# Patient Record
Sex: Female | Born: 1975 | Race: White | Hispanic: Yes | Marital: Married | Smoking: Never smoker
Health system: Southern US, Community
[De-identification: ages and names within clinical notes are randomized; demographics above are authoritative.]

## PROBLEM LIST (undated history)

## (undated) ENCOUNTER — Ambulatory Visit: Payer: Self-pay

## (undated) DIAGNOSIS — K602 Anal fissure, unspecified: Secondary | ICD-10-CM

## (undated) DIAGNOSIS — F419 Anxiety disorder, unspecified: Secondary | ICD-10-CM

## (undated) DIAGNOSIS — R7303 Prediabetes: Secondary | ICD-10-CM

## (undated) DIAGNOSIS — I10 Essential (primary) hypertension: Secondary | ICD-10-CM

## (undated) HISTORY — DX: Essential (primary) hypertension: I10

## (undated) HISTORY — DX: Prediabetes: R73.03

## (undated) HISTORY — DX: Anxiety disorder, unspecified: F41.9

## (undated) HISTORY — DX: Anal fissure, unspecified: K60.2

---

## 2005-01-23 ENCOUNTER — Inpatient Hospital Stay (HOSPITAL_COMMUNITY): Admission: AD | Admit: 2005-01-23 | Discharge: 2005-01-23 | Payer: Self-pay | Admitting: Obstetrics and Gynecology

## 2005-02-12 ENCOUNTER — Inpatient Hospital Stay (HOSPITAL_COMMUNITY): Admission: AD | Admit: 2005-02-12 | Discharge: 2005-02-12 | Payer: Self-pay | Admitting: *Deleted

## 2005-02-25 ENCOUNTER — Encounter (INDEPENDENT_AMBULATORY_CARE_PROVIDER_SITE_OTHER): Payer: Self-pay | Admitting: *Deleted

## 2005-02-25 LAB — CONVERTED CEMR LAB

## 2005-03-13 ENCOUNTER — Ambulatory Visit: Payer: Self-pay | Admitting: Family Medicine

## 2005-03-26 ENCOUNTER — Ambulatory Visit: Payer: Self-pay | Admitting: Family Medicine

## 2005-04-21 ENCOUNTER — Ambulatory Visit: Payer: Self-pay | Admitting: Family Medicine

## 2005-04-23 ENCOUNTER — Ambulatory Visit (HOSPITAL_COMMUNITY): Admission: RE | Admit: 2005-04-23 | Discharge: 2005-04-23 | Payer: Self-pay | Admitting: *Deleted

## 2005-05-21 ENCOUNTER — Ambulatory Visit: Payer: Self-pay | Admitting: Family Medicine

## 2005-05-28 ENCOUNTER — Ambulatory Visit: Payer: Self-pay | Admitting: Family Medicine

## 2005-07-08 ENCOUNTER — Ambulatory Visit: Payer: Self-pay | Admitting: Family Medicine

## 2005-07-28 ENCOUNTER — Ambulatory Visit: Payer: Self-pay | Admitting: Sports Medicine

## 2005-08-13 ENCOUNTER — Ambulatory Visit: Payer: Self-pay | Admitting: Family Medicine

## 2005-08-18 ENCOUNTER — Ambulatory Visit: Payer: Self-pay | Admitting: Family Medicine

## 2005-08-22 ENCOUNTER — Ambulatory Visit: Payer: Self-pay | Admitting: *Deleted

## 2005-08-22 ENCOUNTER — Inpatient Hospital Stay (HOSPITAL_COMMUNITY): Admission: AD | Admit: 2005-08-22 | Discharge: 2005-08-22 | Payer: Self-pay | Admitting: *Deleted

## 2005-08-26 ENCOUNTER — Inpatient Hospital Stay (HOSPITAL_COMMUNITY): Admission: AD | Admit: 2005-08-26 | Discharge: 2005-08-30 | Payer: Self-pay | Admitting: Obstetrics and Gynecology

## 2005-08-26 ENCOUNTER — Ambulatory Visit: Payer: Self-pay | Admitting: Family Medicine

## 2005-08-26 ENCOUNTER — Ambulatory Visit: Payer: Self-pay | Admitting: Obstetrics & Gynecology

## 2005-09-03 ENCOUNTER — Inpatient Hospital Stay (HOSPITAL_COMMUNITY): Admission: AD | Admit: 2005-09-03 | Discharge: 2005-09-03 | Payer: Self-pay | Admitting: Obstetrics & Gynecology

## 2005-10-06 ENCOUNTER — Ambulatory Visit: Payer: Self-pay | Admitting: Family Medicine

## 2005-10-08 ENCOUNTER — Ambulatory Visit: Payer: Self-pay | Admitting: Sports Medicine

## 2006-02-12 IMAGING — US US OB COMP LESS 14 WK
1 series · 18 of 28 positions shown · non-contrast
Comparison: none

CLINICAL DATA: 8 weeks gestation.  Bleeding.

[Series 1: us ob comp<14 wk · 18 of 36 slices shown]
[im 1/36]
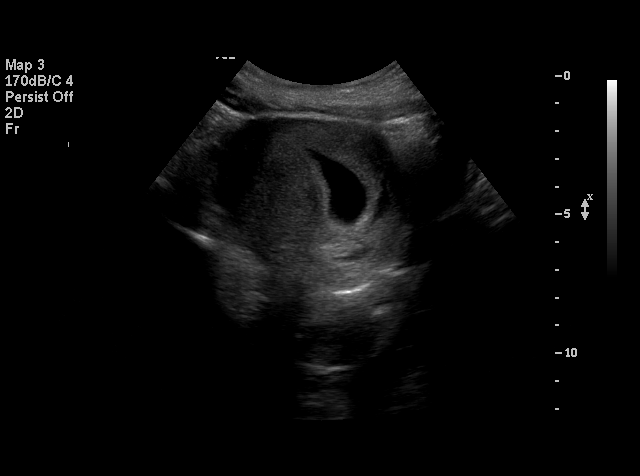
[im 3/36]
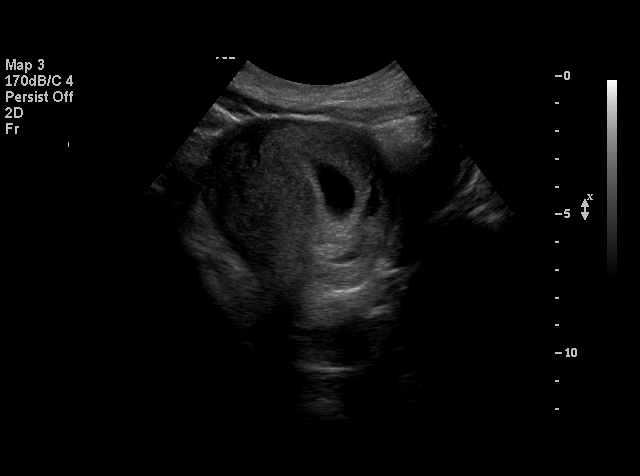
[im 4/36]
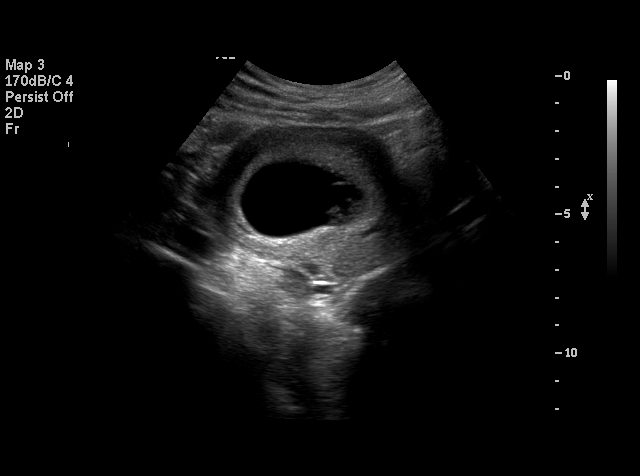
[im 7/36]
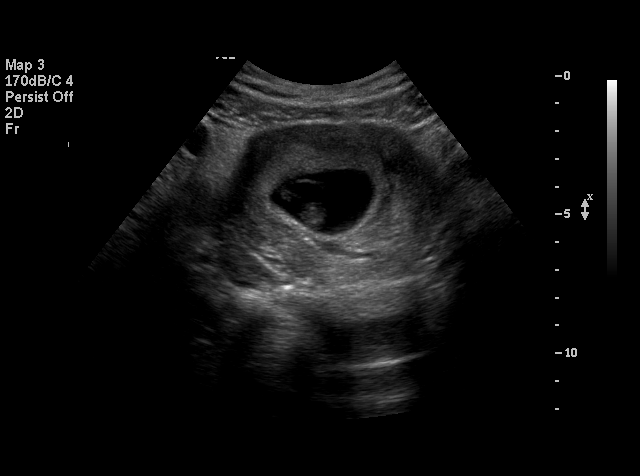
[im 10/36]
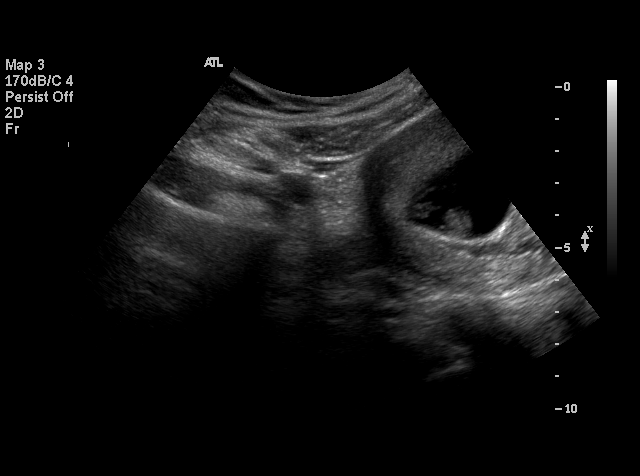
[im 11/36]
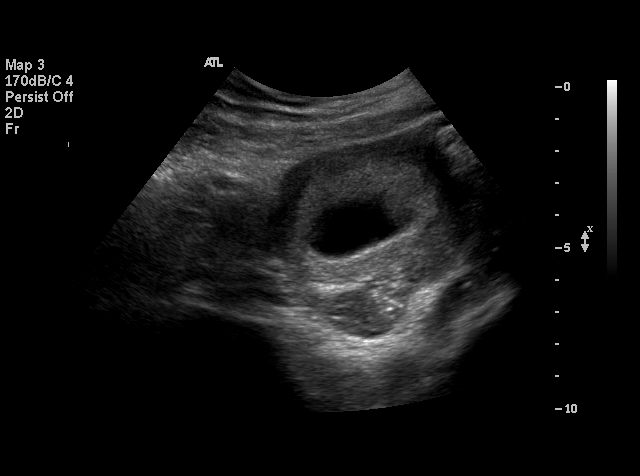
[im 13/36]
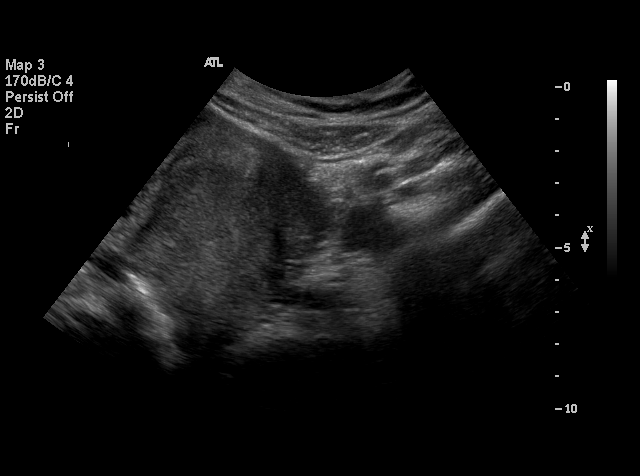
[im 15/36]
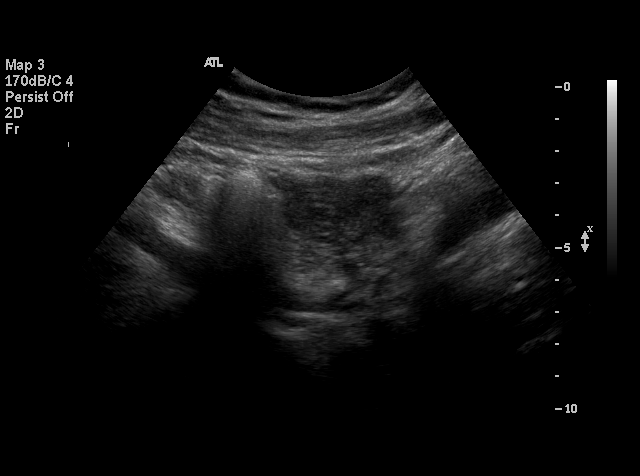
[im 17/36]
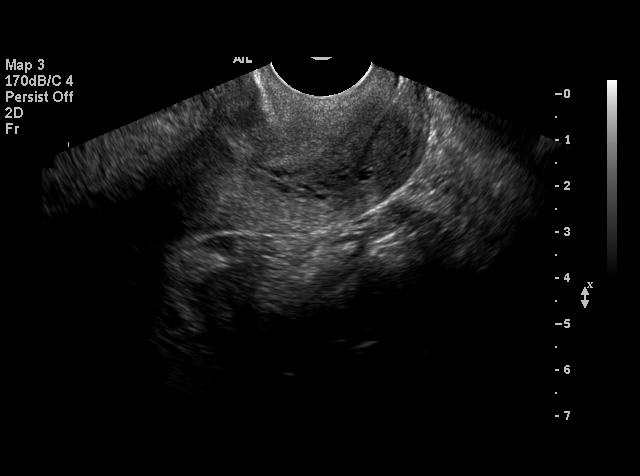
[im 19/36]
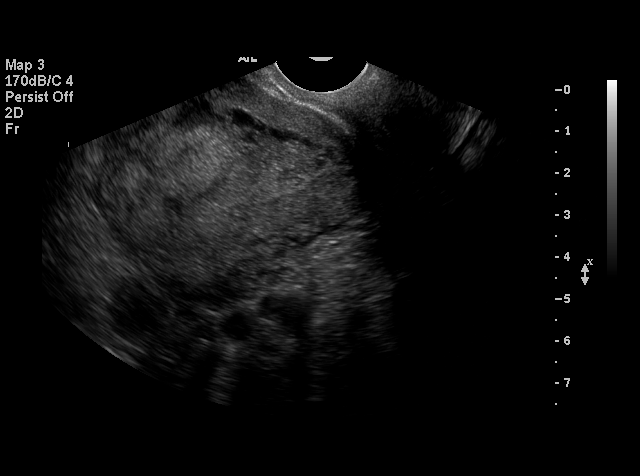
[im 21/36]
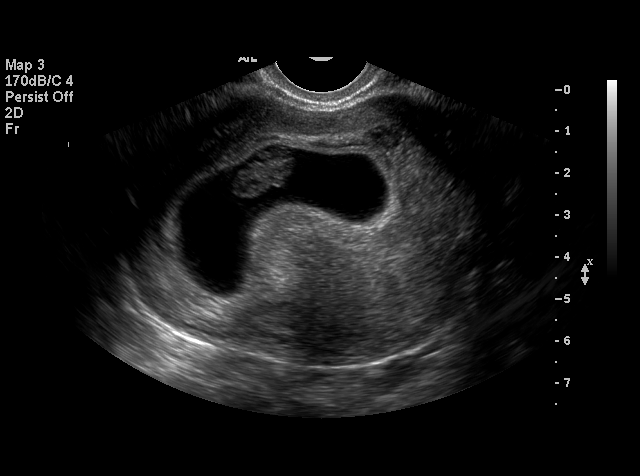
[im 23/36]
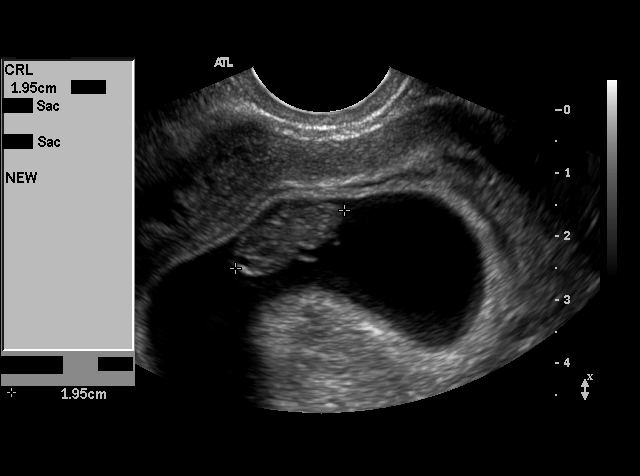
[im 25/36]
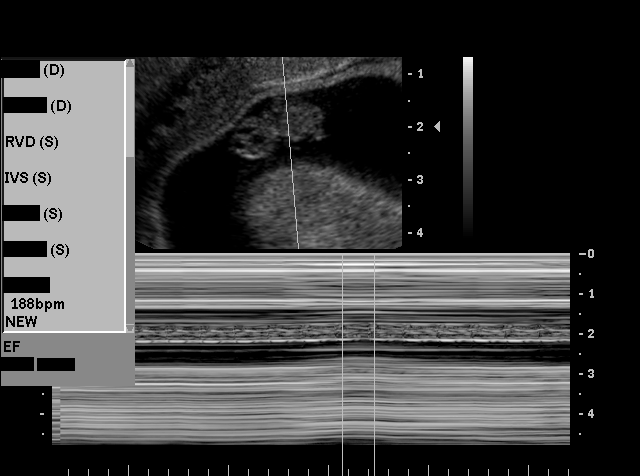
[im 28/36]
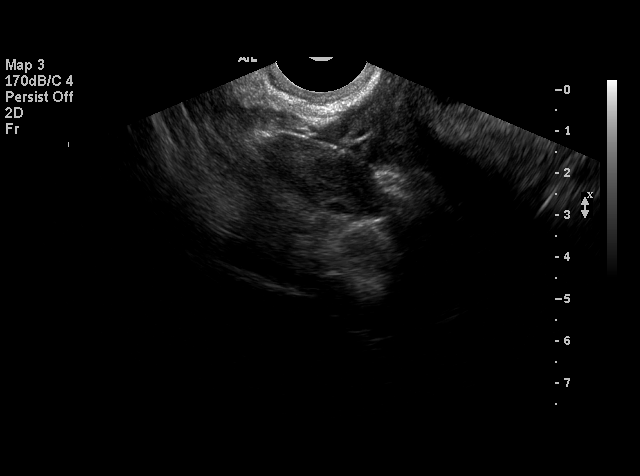
[im 29/36]
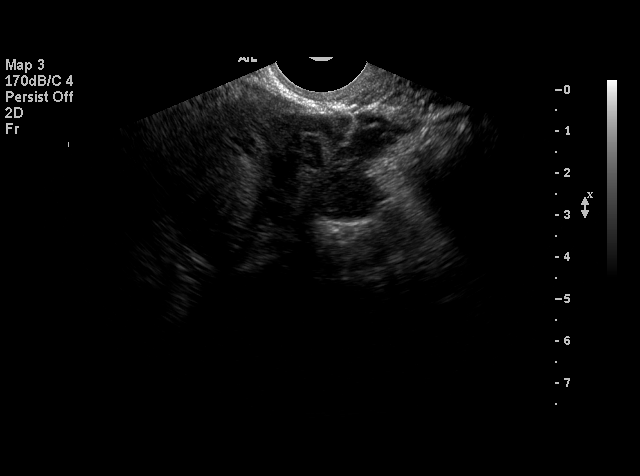
[im 32/36]
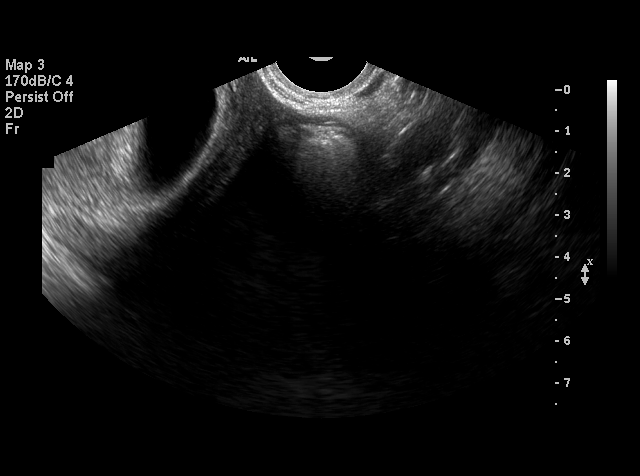
[im 33/36]
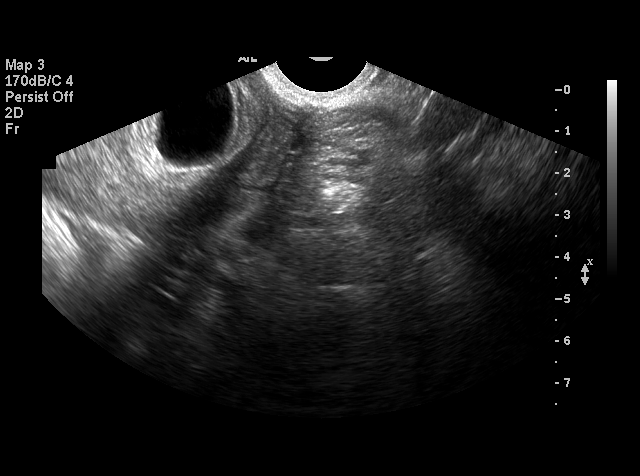
[im 36/36]
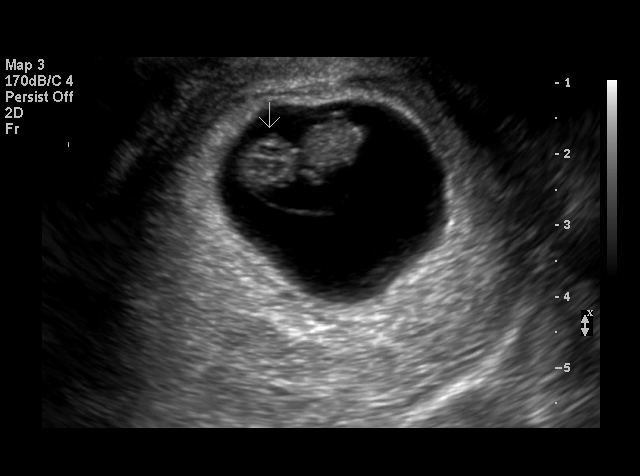

[18 of 28 positions shown; findings below may reference images not displayed]

OBSTETRICAL ULTRASOUND:

 Number of Fetuses: 1
 Heart Rate:  188
 CRL:  1.9 cm   8 w 4 d

 Ultrasound EDC: 08/31/05

 Fetal anatomy could not be evaluated due to the early gestational age.  Yolk sac is visualized.

 MATERNAL UTERINE AND ADNEXAL FINDINGS
 Cervix not evaluated.  Normal right ovary.  Left ovary not seen.
IMPRESSION: 1.  Single living intrauterine gestation at 8 weeks 4 days estimated gestational age by crown rump length.  Yolk sac and amnion are visualized.  A tiny subchorionic hemorrhage is apparent.
 2.  Right ovary is sonographically unremarkable.  The left ovary is not visualized.

## 2006-04-15 ENCOUNTER — Ambulatory Visit: Payer: Self-pay | Admitting: Internal Medicine

## 2006-04-23 ENCOUNTER — Ambulatory Visit: Payer: Self-pay | Admitting: Internal Medicine

## 2006-04-29 ENCOUNTER — Ambulatory Visit: Payer: Self-pay | Admitting: Family Medicine

## 2006-05-13 IMAGING — US US OB COMP +14 WK
1 series · 13 of 28 positions shown · non-contrast
Comparison: none

CLINICAL DATA: Size and dates.

[Series 1: us ob comp +14 wk · 0.29mm/px · 13 of 97 slices shown]
[im 4/97]
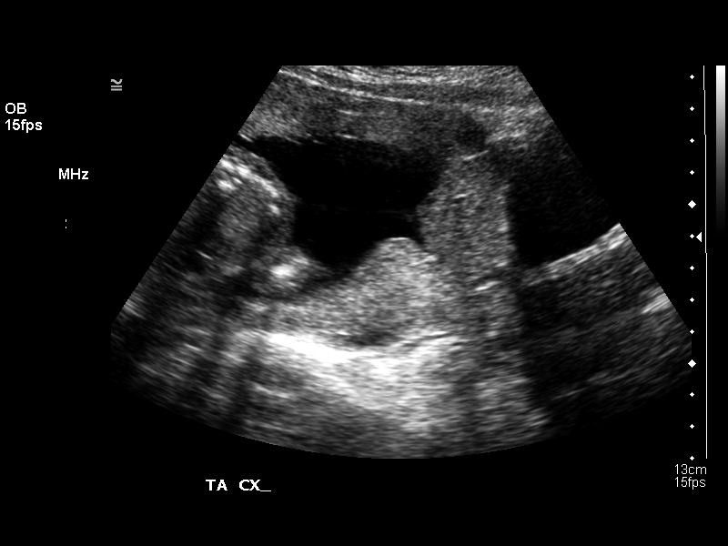
[im 11/97]
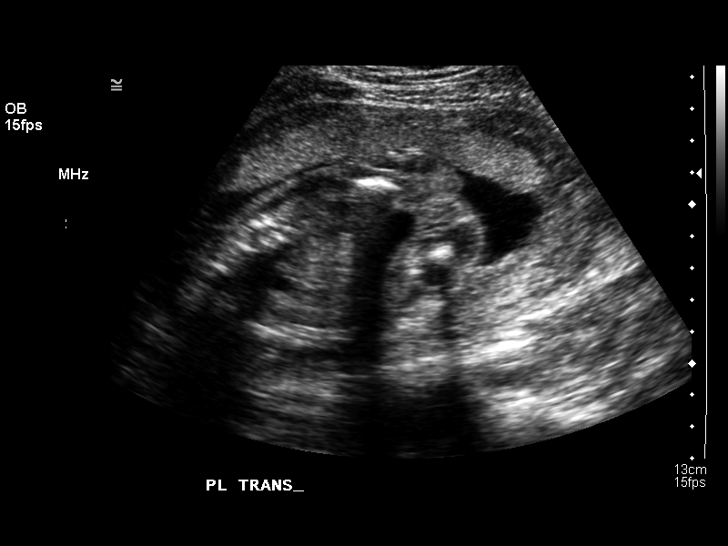
[im 18/97]
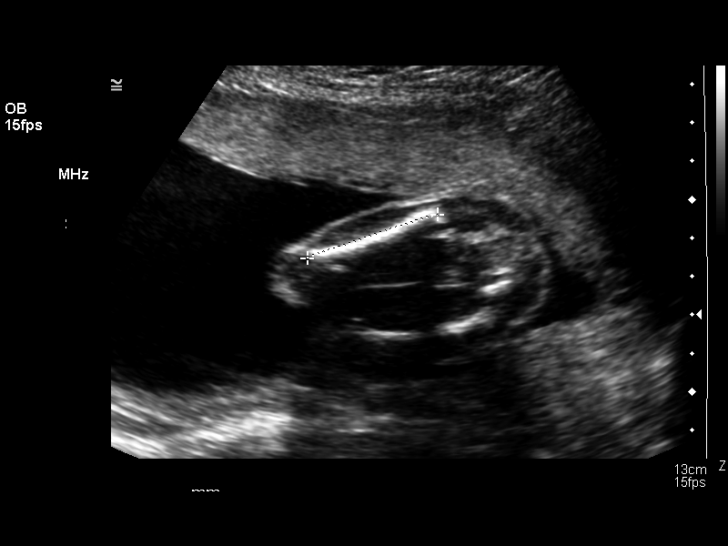
[im 25/97]
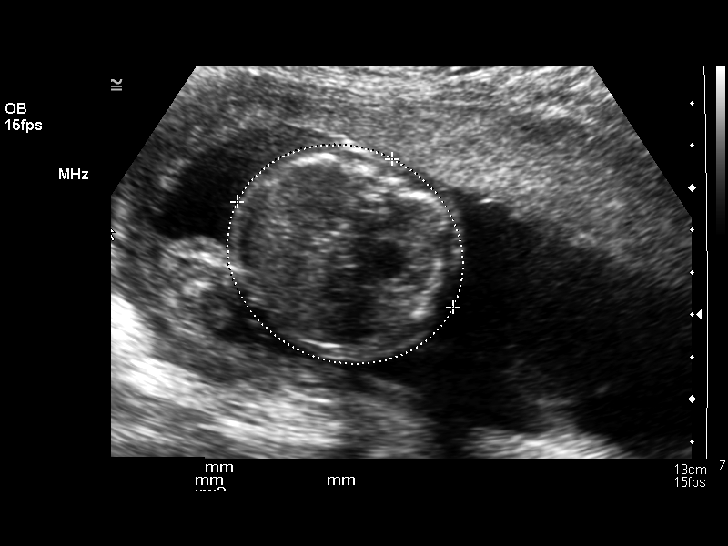
[im 33/97]
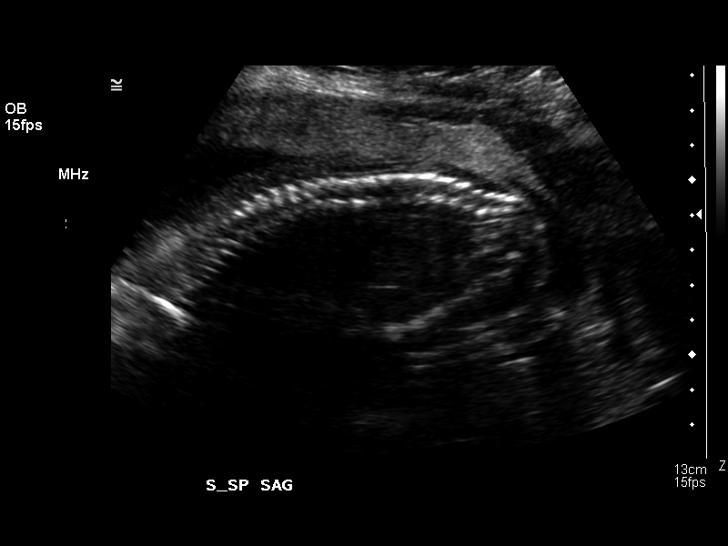
[im 40/97]
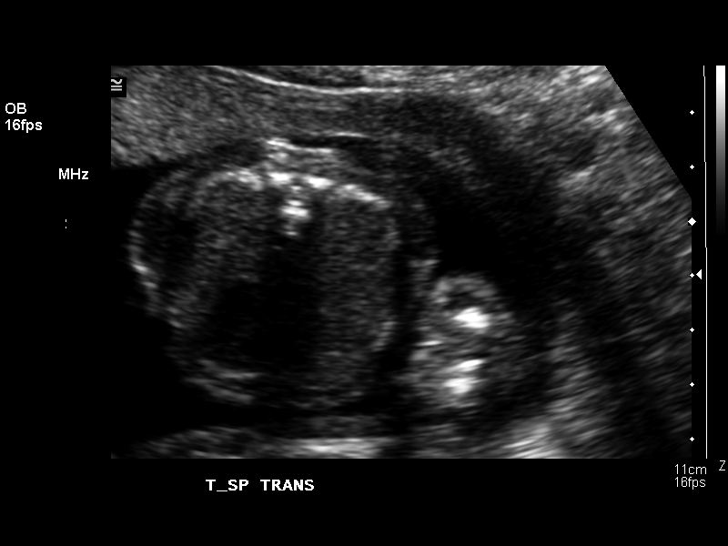
[im 50/97]
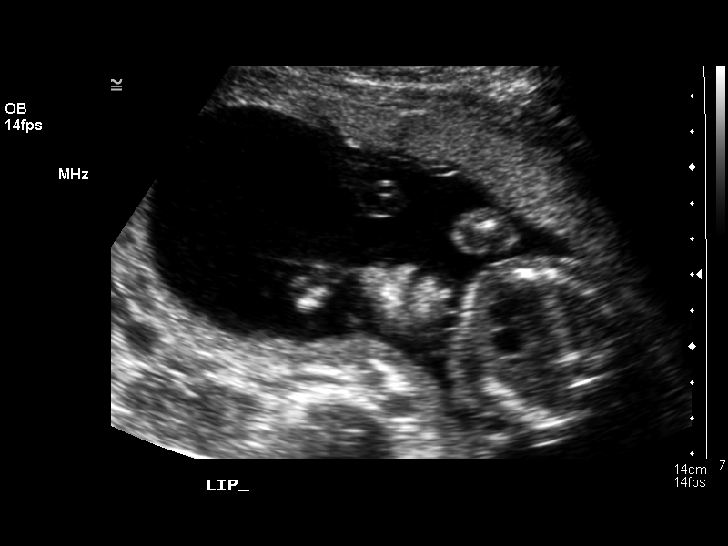
[im 57/97]
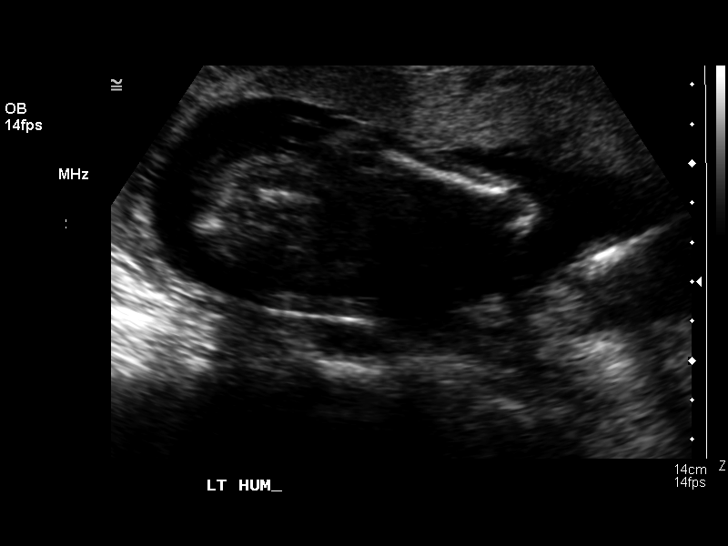
[im 65/97]
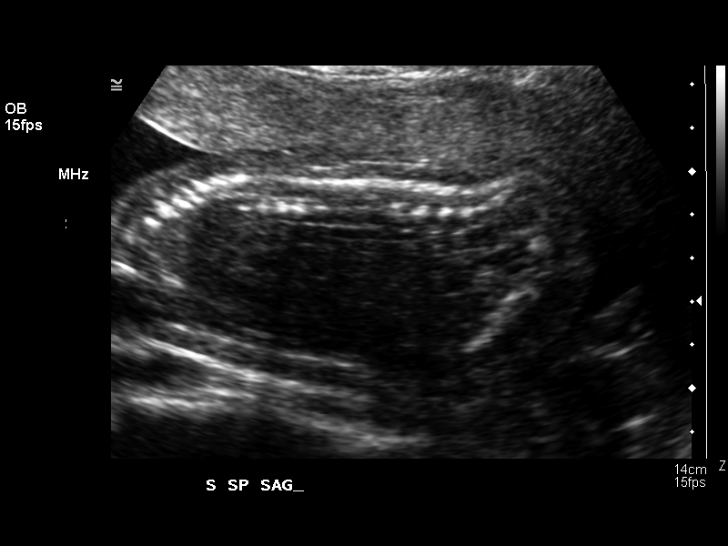
[im 72/97]
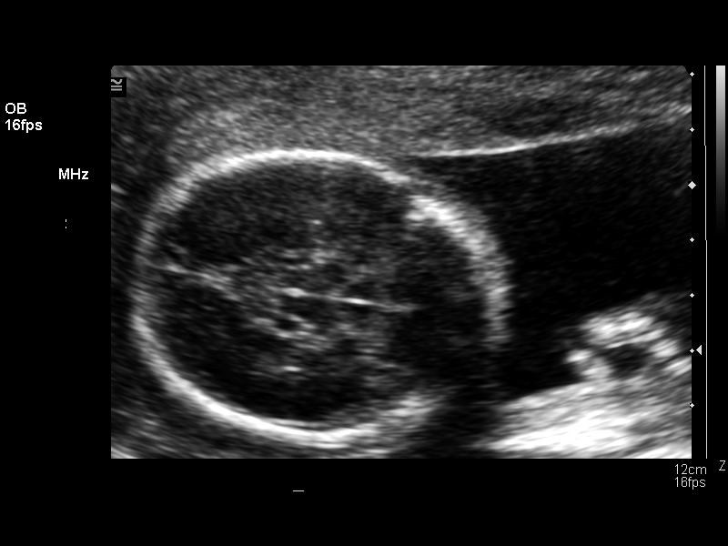
[im 79/97]
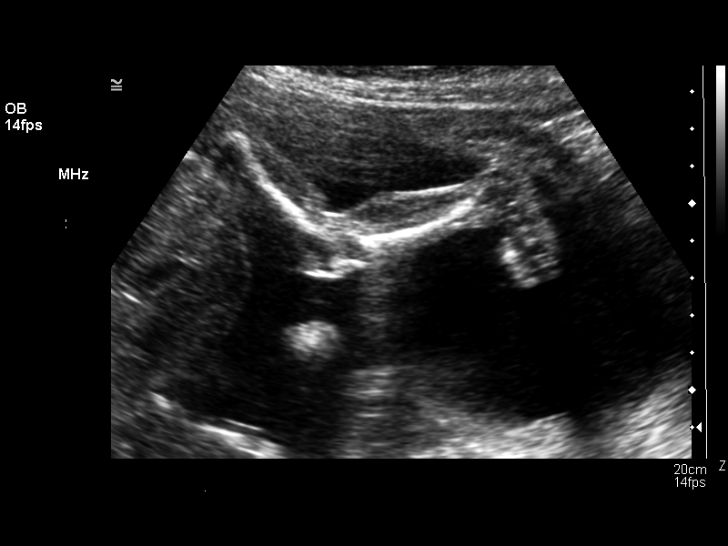
[im 86/97]
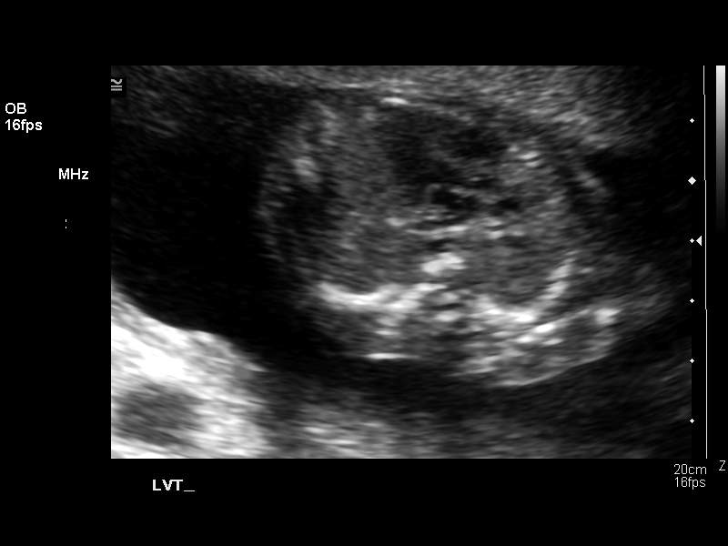
[im 93/97]
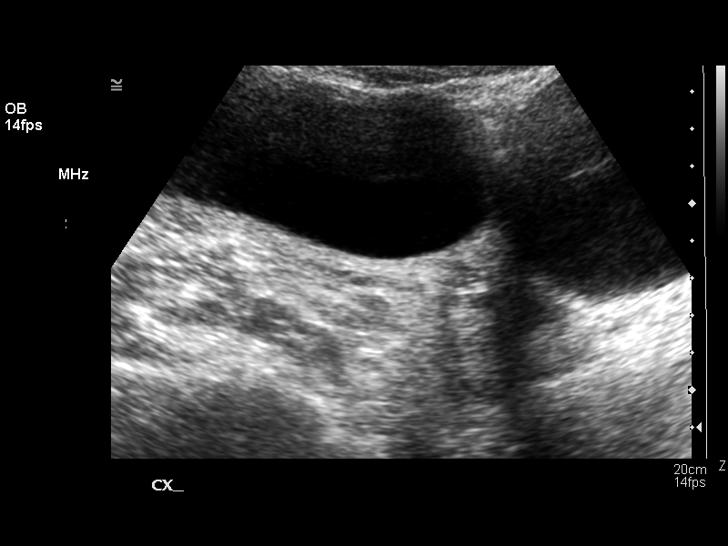

[13 of 28 positions shown; findings below may reference images not displayed]

OBSTETRICAL ULTRASOUND: 
Number of Fetuses:  1
Heart Rate:  141
Movement:  Yes
Breathing:  No  
Presentation:  Transverse
Placental Location:  Anterior
Grade:  I
Previa:  No
Amniotic Fluid (Subjective):  Normal
Amniotic Fluid (Objective):   5.3 cm Vertical pocket 

FETAL BIOMETRY
BPD:  5.1 cm   21 w 4 d
HC:  19.6 cm   21 w 5 d
AC:  16.1 cm  21 w 2 d
FL:    3.6 cm   21 w 2 d

MEAN GA:  21 w 3 d

FETAL ANATOMY
Lateral Ventricles:    Visualized 
Thalami/CSP:      Visualized 
Posterior Fossa:  Visualized 
Nuchal Region:    N/A
Spine:      Visualized 
4 Chamber Heart on Left:      Visualized 
Stomach on Left:      Visualized 
3 Vessel Cord:    Visualized 
Cord Insertion site:    Visualized 
Kidneys:  Visualized 
Bladder:  Visualized 
Extremities:      Visualized 

ADDITIONAL ANATOMY VISUALIZED:  LVOT, RVOT, upper lip, orbits, profile, diaphragm, heel, 5th digit, ductal arch, aortic arch, female genitalia, and nasal bone.

MATERNAL UTERINE AND ADNEXAL FINDINGS
Cervix:   3.5 cm Transabdominally
IMPRESSION: 1.  Single intrauterine pregnancy demonstrating an estimated gestational age by ultrasound of 21 weeks and 3 days.  Correlation with assigned gestational age by LMP of 21 weeks and 3 days suggests appropriate growth.  
2.  No focal fetal or placental abnormalities are noted with a good anatomic exam possible.  

</u12:p>

## 2006-09-11 IMAGING — US US OB FOLLOW-UP
1 series · 13 of 27 positions shown · non-contrast
Comparison: 04/23/05.

CLINICAL DATA: Assess growth.

 OBSTETRICAL ULTRASOUND RE-EVALUATION:

[Series 1: us ob follow-up · 0.37mm/px · 13 of 27 slices shown]
[im 2/27]
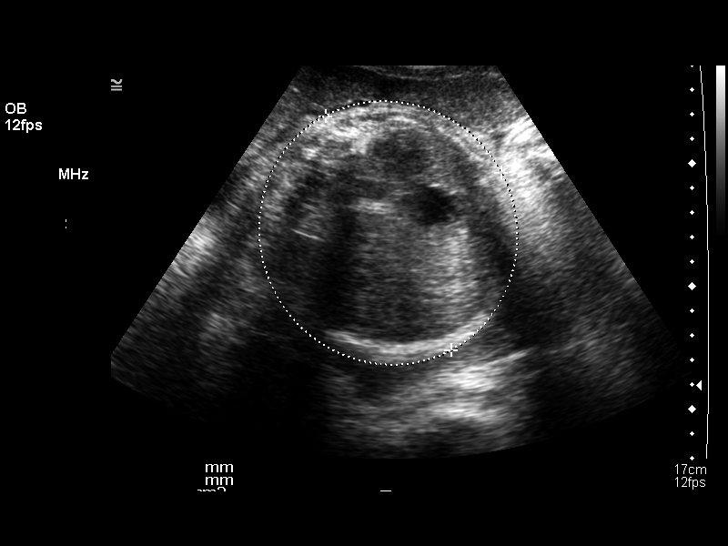
[im 4/27]
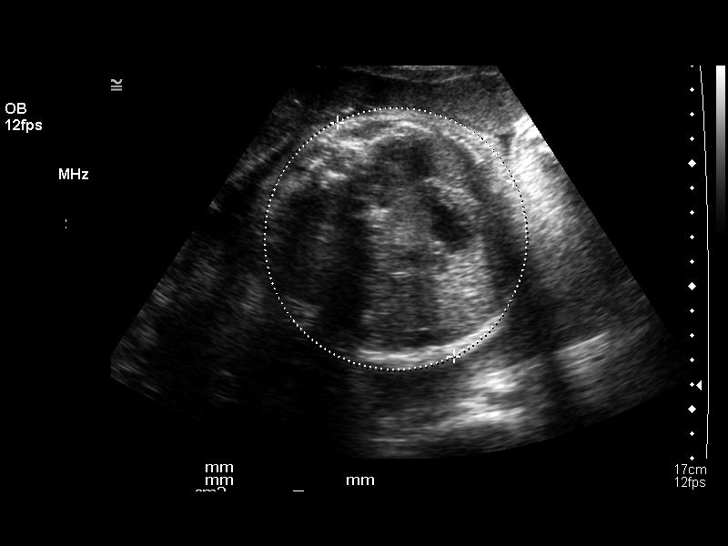
[im 6/27]
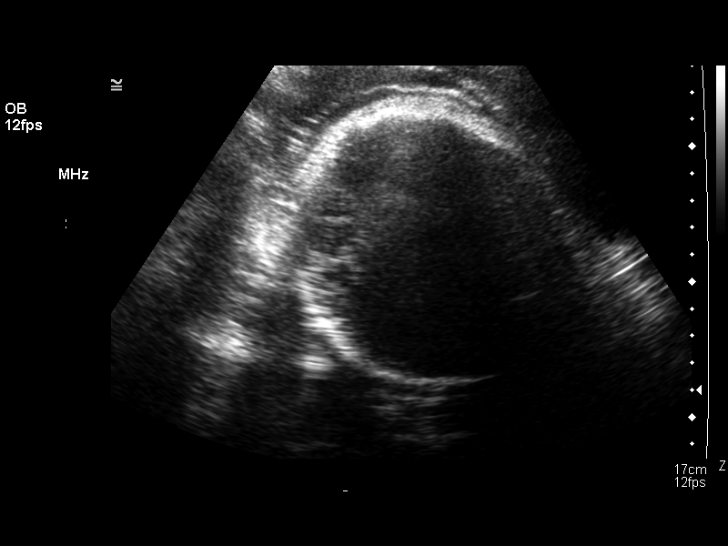
[im 8/27]
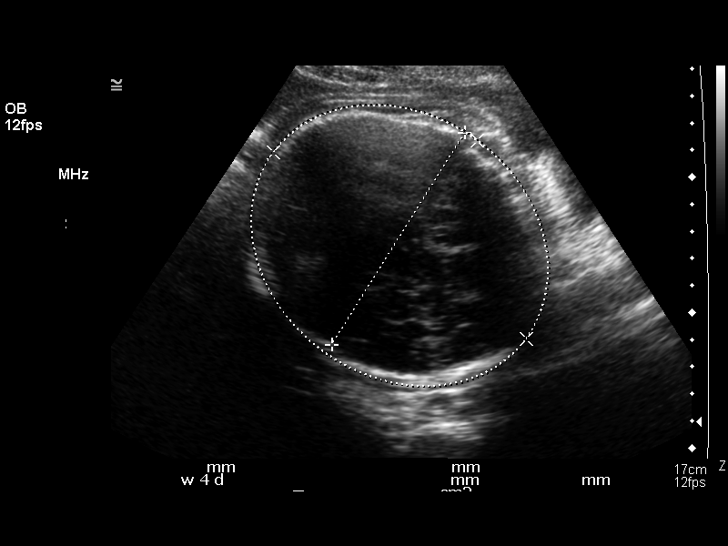
[im 10/27]
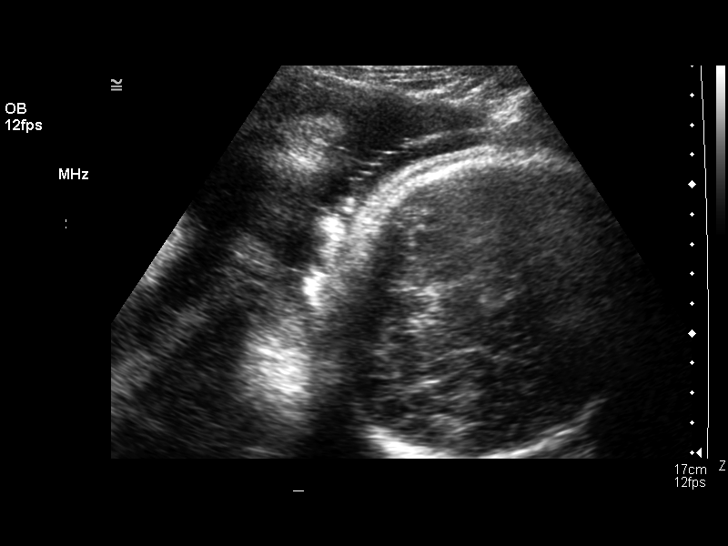
[im 12/27]
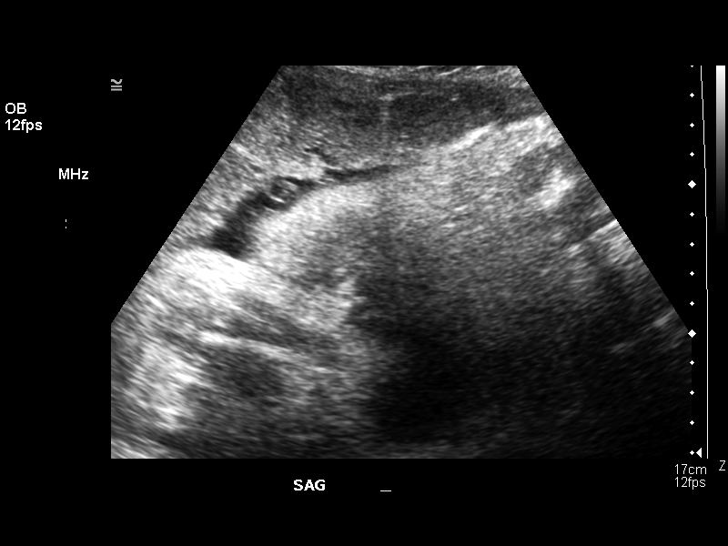
[im 14/27]
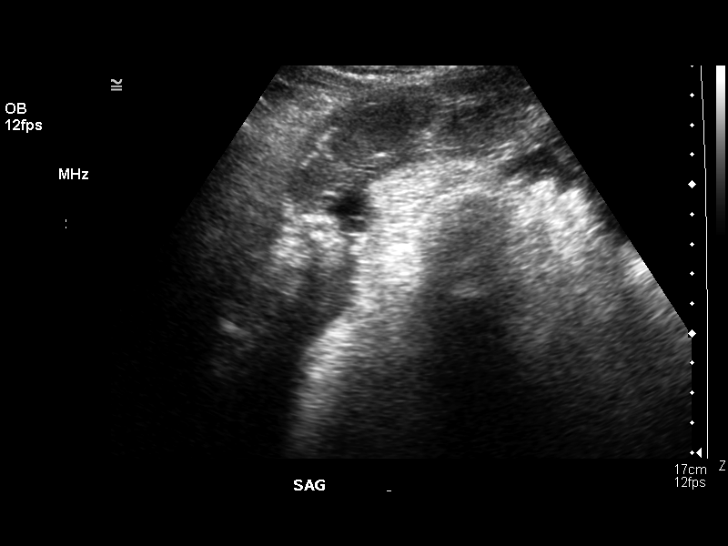
[im 16/27]
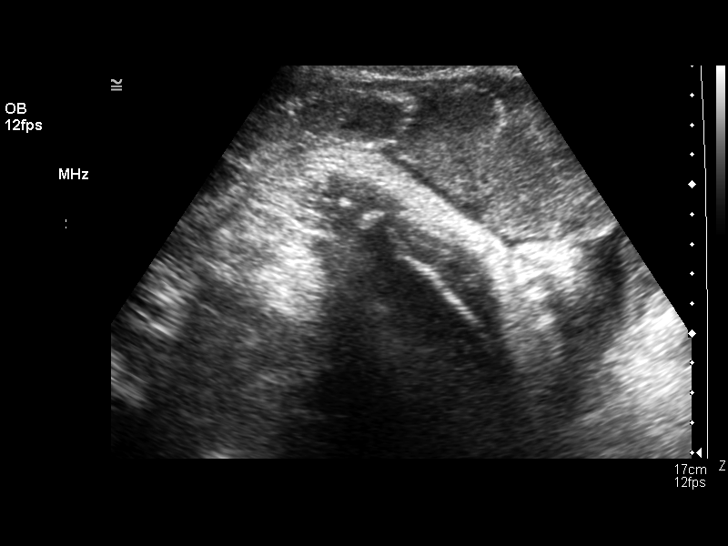
[im 18/27]
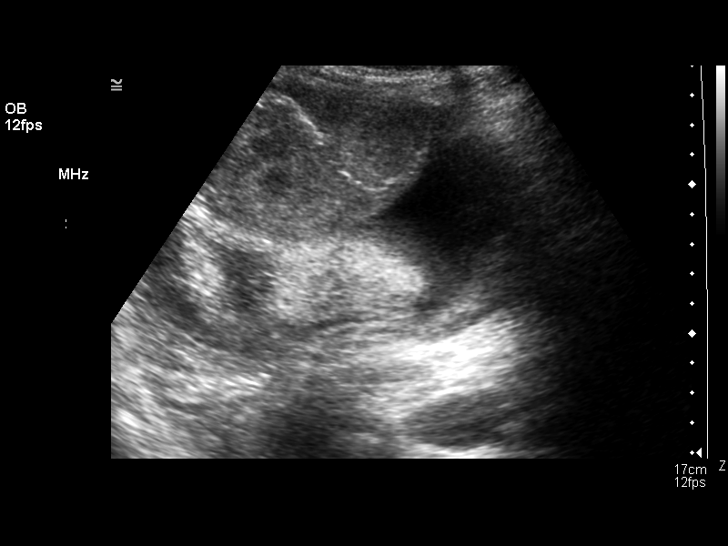
[im 20/27]
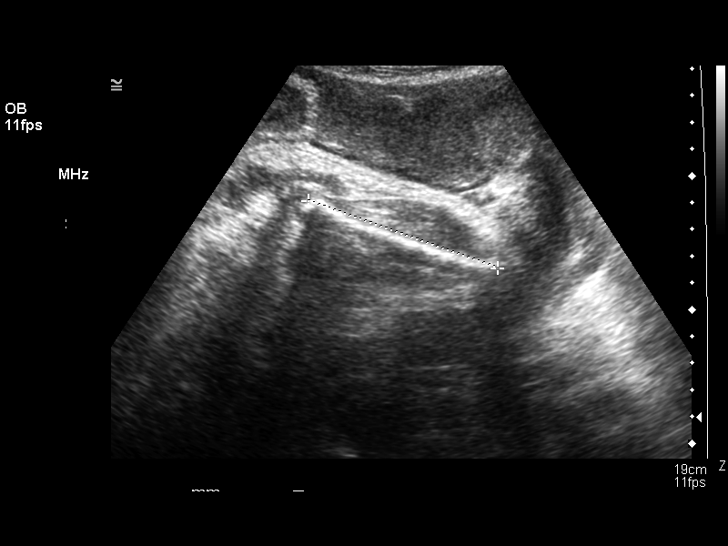
[im 22/27]
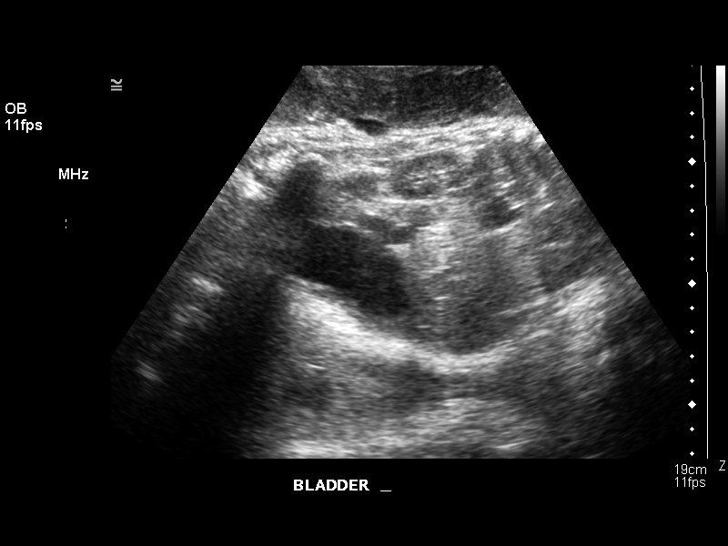
[im 24/27]
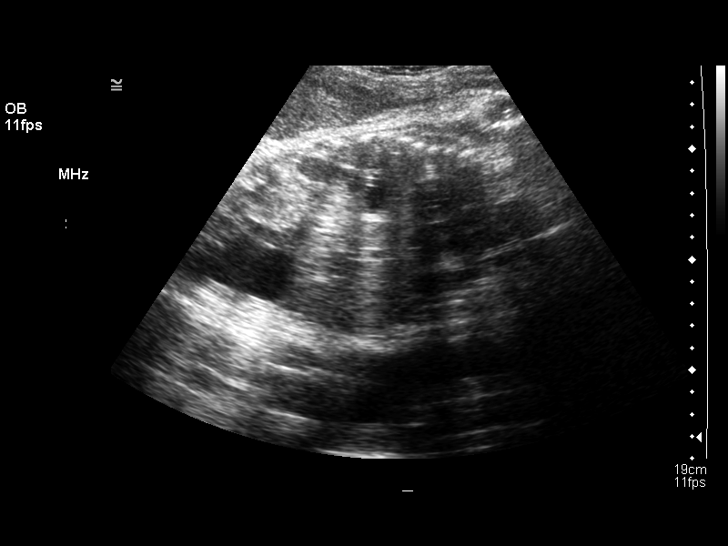
[im 26/27]
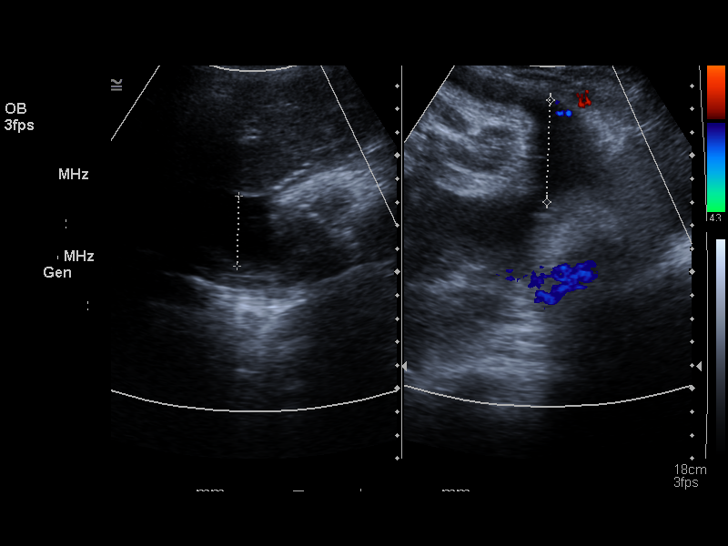

[13 of 27 positions shown; findings below may reference images not displayed]

Number of Fetuses:  1
 Heart Rate:  147
 Movement:  Yes
 Breathing:  No
 Presentation:  Cephalic
 Placental Location:  Anterior
 Grade:  III
 Previa: No
 Amniotic Fluid (subjective):  Normal 
 Amniotic Fluid (objective):  16.0 cm AFI (5th -95th%ile = 7.2 – 22.6 cm for 39 wks)

 FETAL BIOMETRY
 BPD:  9.2 cm   37 w 3 d
 HC:  33.4 cm   38 w 1 d
 AC:   33.4 cm   37 w 2 d
 FL:   7.5 cm  38 w 1 d

 Mean GA:  37 w 5 d
 Assigned GA:  38 w 2 d

 EFW:  0435 g (H) 25th – 50th%ile (5936 – 6244 g) For 39 wks

 FETAL ANATOMY
 Lateral Ventricles:  Visualized 
 Thalami/CSP:  Previously seen 
 Posterior Fossa:  Previously seen 
 Nuchal Region:  N/A
 Spine:  Previously seen 
 4 Chamber Heart on Left:  Previously seen 
 Stomach on Left:  Visualized 
 3 Vessel Cord:  Visualized 
 Cord Insertion Site:  Previously seen 
 Kidneys:  Visualized 
 Bladder:  Visualized 
 Extremities:    Previously seen 

 MATERNAL UTERINE AND ADNEXAL FINDINGS
 Cervix:  Not evaluated (> 37 wks)
IMPRESSION: 1.  Single fetus, currently vertex, with an estimated gestational age of 37 weeks 5 days.  
 2.  Placenta and amniotic fluid normal.  
 3.  Fetal weight estimated at 0435 grams.

## 2007-02-25 ENCOUNTER — Encounter (INDEPENDENT_AMBULATORY_CARE_PROVIDER_SITE_OTHER): Payer: Self-pay | Admitting: *Deleted

## 2016-08-11 ENCOUNTER — Ambulatory Visit (INDEPENDENT_AMBULATORY_CARE_PROVIDER_SITE_OTHER): Payer: Self-pay | Admitting: Family Medicine

## 2016-08-11 VITALS — BP 98/68 | HR 82 | Temp 98.4°F | Resp 16 | Ht <= 58 in | Wt 164.6 lb

## 2016-08-11 DIAGNOSIS — M542 Cervicalgia: Secondary | ICD-10-CM

## 2016-08-11 MED ORDER — CYCLOBENZAPRINE HCL 10 MG PO TABS: 10.0000 mg | ORAL_TABLET | Freq: Three times a day (TID) | ORAL | 0 refills | Status: DC | PRN

## 2016-08-11 MED ORDER — NAPROXEN 500 MG PO TABS: 500.0000 mg | ORAL_TABLET | Freq: Two times a day (BID) | ORAL | 0 refills | Status: DC

## 2016-08-11 NOTE — Patient Instructions (Signed)
Tome naprosyn 500 mg Toys 'R' Usdos veces al da con los alimentos para disminuir la inflamacin. Tome cyclobenzaprine 10 mg hasta 3 veces al da como sea necesario para la tensin muscular. Seguimiento para programar un examen fsico completo.   Take naprosyn 500 mg twice daily with food to decrease inflammation. Take cyclobenzaprine 10 mg up to 3 times daily as needed for muscle tension.  Follow-up to schedule a complete physical exam.  Godfrey PickKimberly S. Tiburcio PeaHarris, MSN, FNP-C [facility]     IF you received an x-ray today, you will receive an invoice from [facility]. Please contact [facility] at [phone] with questions or concerns regarding your invoice.   IF you received labwork today, you will receive an invoice from [facility]. Please contact [facility] at [phone] with questions or concerns regarding your invoice.   Our billing staff will not be able to assist you with questions regarding bills from these companies.  You will be contacted with the lab results as soon as they are available. The fastest way to get your results is to activate your My Chart account. Instructions are located on the last page of this paperwork. If you have not heard from us regarding the results in 2 weeks, please contact this office.     Distensin y esguince cervical con rehabilitacin (Cervical Strain and Sprain With Rehab) La distensin y el esguince cervical suelen deberse a lesiones provocadas por movimientos de Social research officer, governmentlatigazo cervical. El latigazo cervical es un movimiento de flexin del cuello hacia atrs o adelante que es brusco y Nutritional therapistenrgico, por ejemplo, durante un accidente automovilstico o mientras se practican deportes de contacto. Los msculos, los ligamentos, los tendones, los discos y los nervios del cuello son propensos a lesionarse cuando esto ocurre. FACTORES DE RIESGO El riesgo de sufrir un esguince cervical aumenta  con lo siguiente:  Artrosis de columna.  Situaciones que aumentan la probabilidad de sufrir accidentes o traumatismos de la cabeza o el cuello.  Deportes de Conservator, museum/galleryalto riesgo (ftbol americano, rugby, hockey, automovilismo, gimnasia, buceo, karate de contacto o boxeo).  Poca fuerza y flexibilidad en el cuello.  Lesin previa en el cuello.  Mala tcnica de placaje.  Equipo de calce inadecuado o que no est bien acolchado. SNTOMAS   Dolor o rigidez en la parte delantera o posterior del cuello, o en ambas.  Los sntomas pueden aparecer de inmediato o en el trmino de 24horas despus de la lesin.  Mareos, dolor de Turkmenistancabeza, nuseas y vmitos.  Espasmo muscular con dolor y rigidez en el cuello.  Dolor a la palpacin e Paramedichinchazn en el lugar de la lesin. PREVENCIN  Aprenda y use las tcnicas adecuadas (no plaque ni embista con la cabeza, ni d topetazos; use las tcnicas correctas para caer a fin de evitar caerse de cabeza).  Haga los ejercicios de precalentamiento y elongacin correctos antes de la Winfieldactividad.  Mantngase en buen estado fsico:  Earma ReadingFuerza, flexibilidad y resistencia.  Buen estado cardiovascular.  Use equipo de proteccin que calce correctamente y est bien acolchado, por ejemplo, collarines blandos acolchados, cuando practique deportes de contacto. PRONSTICO  La recuperacin de las lesiones por distensin y esguince cervical depende de la magnitud de la lesin. Generalmente, la lesin se cura en el trmino de 1semana a 3meses con el tratamiento adecuado.  COMPLICACIONES RELACIONADAS   Pueden presentarse adormecimiento y debilidad temporarios si las races nerviosas estn daadas, que pueden continuar hasta que el nervio est completamente curado.  Dolor  crnico debido a la recurrencia frecuente de los sntomas.  Recuperacin prolongada, especialmente si se reanuda la [employer] pronto (antes de la recuperacin total). TRATAMIENTO  Inicialmente, el  tratamiento incluye el uso de hielo y medicamentos para ayudar a Best boy y la inflamacin. Tambin es Publishing rights manager ejercicios de fortalecimiento y Landscape architect, y Radio broadcast assistant las actividades que intensifican los sntomas para que la lesin no empeore. Estos ejercicios pueden realizarse en la casa o con un terapeuta. A los pacientes que tienen sntomas intensos, tal vez se les recomiende el uso de un collarn blando acolchado alrededor del cuello.  Mejorar la postura puede ayudar a [employer] sntomas. La mejora de la postura incluye hundir el abdomen y el mentn mientras est de pie o sentado. Si se sienta, hgalo en una silla firme con los glteos apoyados contra el respaldo. Mientras duerme, intente reemplazar la almohada por una toalla pequea enrollada de 2pulgadas (5centmetros) de dimetro, o use una almohada cervical o un collarn cervical blando. Las [employer] posiciones al dormir Radiographer, therapeutic.  Para los pacientes que tienen dao de las races nerviosas que les causa adormecimiento o debilidad, puede ser recomendable un aparato de traccin cervical. En contadas ocasiones, se debe realizar una ciruga para tratar estas lesiones. Sin embargo, es posible que la distensin y los esguinces cervicales que estn presentes al nacer (congnitos) requieran [place]. MEDICAMENTOS   Si se necesitan analgsicos, a menudo se recomiendan los antiinflamatorios no esteroides, como la aspirina y el ibuprofeno, u otros analgsicos suaves, como el paracetamol.  No tome analgsicos durante 7das antes de la [place].  Se pueden administrar analgsicos recetados si el mdico lo considera necesario. Utilcelos como se le indique y solo cuando lo necesite. CALOR Y FRO:   El tratamiento confro (aplicacin de hielo) Futures trader dolor y reduce la inflamacin. Este se debe aplicar durante 10 a [id number] cada 2 o 3horas para la inflamacin y Conservation officer, historic buildings, e inmediatamente despus de Optometrist cualquier  actividad que intensifique los sntomas. Use bolsas de hielo o un masaje con hielo.  Se puede usar Charity fundraiser antes de Optometrist las actividades de elongacin y fortalecimiento indicadas por el mdico, el fisioterapeuta o Industrial/product designer. Pngase una compresa caliente o dese un bao tibio de inmersin. SOLICITE ATENCIN MDICA SI:   Los sntomas empeoran o no mejoran en 2semanas, a pesar de Chiropodist.  Presenta sntomas nuevos sin motivo aparente (los medicamentos utilizados durante el tratamiento pueden producir [place]). EJERCICIOS EJERCICIOS DE AMPLITUD DE MOVIMIENTOS Y DE ELONGACIN: distensin y esguince cervical Estos ejercicios pueden ser de ayuda al comenzar la rehabilitacin de la lesin. Para que los sntomas se resuelvan satisfactoriamente, debe mejorar la postura. Estos ejercicios estn diseados para ayudar a Museum/gallery exhibitions officer de la cabeza hacia adelante y protraccin de los hombros, la cual contribuye a Personnel officer. Los sntomas pueden resolverse con o sin mayor intervencin del mdico, el fisioterapeuta o Industrial/product designer. Mientras realiza estos ejercicios, recuerde lo siguiente:   Al restablecer la flexibilidad de los tejidos, las articulaciones recuperan el movimiento normal, lo que permite movimientos y actividades ms dinmicos y con Producer, television/film/video.  La elongacin eficaz se debe mantener durante por lo menos 20segundos, aunque tal vez deba comenzar con sesiones de [employer] para su comodidad.  La elongacin nunca debe ser dolorosa. Solo debe sentir un estiramiento o aflojamiento suave en tejido en elongacin. ELONGACIN: extensores axiales  Acustese en el piso boca arriba. Puede flexionar las rodillas para estar cmodo.  Coloque un toalla de mano o un repasador enrollado, de unas 2pulgadas (5centmetros) de dimetro, debajo de la zona de la cabeza que est apoyada sobre el piso.  Suavemente hunda el [place], como si intentara formar una  papada, [place] sentir una leve elongacin en la base de la cabeza.  Mantenga la posicin durante __________segundos. Repita __________veces. Haga este ejercicio __________veces por da.  ELONGACIN: extensin axial  Prese o sintese sobre una superficie firme. Adopte una postura correcta: el pecho erguido, los hombros [employer], los msculos abdominales apenas tensos, las rodillas sin trabar (si est de pie) y los pies separados al ancho las caderas.  Con un movimiento lento, lleve el [employer], de modo que la cabeza se deslice hacia atrs y el mentn baje levemente. Siga mirando hacia adelante.  Debe sentir una elongacin [employer] parte posterior de la cabeza. Tenga presente que la elongacin no tiene que ser brusca ya que esto puede causar dolores de cabeza ms tarde.  Mantenga la posicin durante __________segundos. Repita __________veces. Haga este ejercicio __________veces por da. ELONGACIN: flexin cervical lateral   Prese o sintese sobre una superficie firme. Adopte una postura correcta: el pecho erguido, los hombros [employer], los msculos abdominales apenas tensos, las rodillas sin trabar (si est de pie) y los pies separados al ancho las caderas.  Sin mover la nariz ni los hombros, lentamente deje caer la oreja derecha / izquierdo hacia el hombro hasta sentir la elongacin suave de los msculos del lado contrario del cuello.  Mantenga la posicin durante __________segundos. Repita __________veces. Haga este ejercicio __________veces por da. ELONGACIN: rotadores cervicales   Prese o sintese sobre una superficie firme. Adopte una postura correcta: el pecho erguido, los hombros [employer], los msculos abdominales apenas tensos, las rodillas sin trabar (si est de pie) y los pies separados al ancho las caderas.  Con los ojos nivelados con el piso, gire lentamente la cabeza hasta sentir una elongacin [place] a lo largo de la espalda y el lado opuesto  del cuello.  Mantenga la posicin durante __________segundos. Repita __________veces. Haga este ejercicio __________veces por da. AMPLITUD DE MOVIMIENTOS: crculos con el cuello   Prese o sintese sobre una superficie firme. Adopte una postura correcta: el pecho erguido, los hombros [employer], los msculos abdominales apenas tensos, las rodillas sin trabar (si est de pie) y los pies separados al ancho las caderas.  Suavemente baje la cabeza y haga movimientos circulares desde la parte posterior de un hombro hasta la parte posterior del [place]. El movimiento nunca debe ser forzado ni doloroso.  Repita el movimiento 10 o 20veces, o hasta que sienta que los msculos del cuello se Engineer, agricultural y se aflojan. Repita __________veces. Haga el ejercicio __________veces por da. EJERCICIOS DE FORTALECIMIENTO: distensin y esguince cervical Estos ejercicios pueden ser de ayuda al comenzar la rehabilitacin de la lesin. Estos pueden resolver los sntomas con o sin mayor intervencin del mdico, el fisioterapeuta o Industrial/product designer. Mientras realiza estos ejercicios, recuerde lo siguiente:   Los msculos pueden adquirir la resistencia y la fuerza necesarias para las actividades cotidianas a travs de ejercicios controlados.  Realice estos ejercicios como se lo hayan indicado el mdico, el fisioterapeuta o Industrial/product designer. Aumente la resistencia y las repeticiones solo como se lo hayan indicado.  Puede tener dolor o fatiga muscular; sin embargo, Conservation officer, historic buildings o las molestias que intenta eliminar nunca deben intensificarse durante la realizacin de estos ejercicios. Si el dolor se intensifica, detngase y asegrese de Personnel officer siguiendo  las indicaciones de [employer]. Si an siente dolor despus de los ajustes, deje de hacer el ejercicio hasta tanto pueda analizar el problema con el mdico. FUERZA: flexores cervicales, isomtrico   Prese de frente a una pared a una distancia aproximada de 6pulgadas  (15centmetros). Coloque una almohada pequea, una pelota de unas 6 a 8pulgadas (15 a 20centmetros) de dimetro o una toalla doblada entre la frente y la pared.  Hunda levemente el mentn y, con [place], empuje el objeto blando con la frente. La intensidad del empuje debe ser leve a moderada, y la tensin debe aumentarse de manera gradual. Mantenga relajadas la mandbula y la frente.  Mantenga la posicin durante 10 a 20segundos. Respire tranquilo.  [place] lentamente la tensin. Relaje los msculos del cuello por completo antes de comenzar la siguiente repeticin. Repita __________veces. Haga este ejercicio __________veces por da. FUERZA: flexores cervicales laterales, isomtrico   Prese a una distancia aproximada de 6pulgadas (15centmetros) de una pared. Coloque una almohada pequea, una pelota de unas 6 a 8pulgadas (15 a 20centmetros) de Occupational hygienist o una toalla doblada entre el costado de la cabeza y la pared.  Hunda levemente el mentn y, con [place], empuje el objeto blando con la [place]. La intensidad del empuje debe ser leve a moderada, y la tensin debe aumentarse de manera gradual. Mantenga relajadas la mandbula y la frente.  Mantenga la posicin durante 10 a 20segundos. Respire tranquilo.  [place] lentamente la tensin. Relaje los msculos del cuello por completo antes de comenzar la siguiente repeticin. Repita __________veces. Haga este ejercicio __________veces por da. FUERZA: extensores cervicales, isomtrico  Prese a una distancia aproximada de 6pulgadas (15centmetros) de una pared. Coloque una almohada pequea, una pelota de unas 6 a 8pulgadas (15 a 20centmetros) de dimetro o una toalla doblada entre la zona posterior de la cabeza y la pared.  Hunda levemente el mentn y, con [place], empuje el objeto blando con la parte posterior de la cabeza. La intensidad del empuje debe ser leve a moderada, y la tensin debe aumentarse de manera gradual.  Mantenga relajadas la mandbula y la frente.  Mantenga la posicin durante 10 a 20segundos. Respire tranquilo.  [place] lentamente la tensin. Relaje los msculos del cuello por completo antes de comenzar la siguiente repeticin. Repita __________veces. Haga este ejercicio __________veces por da. CONSIDERACIONES ACERCA DE LA POSTURA Y LA MECNICA DEL CUERPO: distensin y esguince cervical Mantener una postura correcta mientras est sentado, de pie o realizando sus actividades reducir la tensin en los diferentes tejidos del cuerpo, lo que permitir la recuperacin de los tejidos lesionados y la disminucin de las experiencias que Financial risk analyst. A continuacin se incluyen pautas generales para mejorar la postura. El mdico o el fisioterapeuta le darn indicaciones especficas para sus necesidades. Mientras lea estas pautas, recuerde lo siguiente:  Los ejercicios que el mdico le indique lo ayudarn a Systems analyst flexibilidad y la fuerza para Theatre manager las posturas correctas.  La postura correcta ofrece a las articulaciones el entorno ptimo para su funcionamiento. Todas las articulaciones sufren un desgaste menor cuando la columna est en la postura correcta y brinda un sostn adecuado. Esto significa un cuerpo ms sano y con [employer].  En todas las actividades, la postura debe ser la correcta, especialmente cuando est sentado o de pie. La postura correcta es igual de importante cuando realiza actividades repetitivas con bajo nivel de tensin (tipear) y cuando lleva a cabo una nica actividad con cargas pesadas (levantar objetos). [place]  LEVEMENTE HACIA ADELANTE  Cuando realice una tarea que le exija inclinarse hacia adelante mientras est de pie en un lugar durante mucho tiempo, apoye un pie sobre un objeto inmvil que tenga una altura de 2 a 4pulgadas (5 a 10centmetros), para Therapist, occupational. Cuando ambos pies estn apoyados en el piso, la parte  baja de la espalda tiende a perder la curvatura leve que tiene [place]. Si esta curva se aplana (o se vuelve muy pronunciada), aumentar mucho la tensin sobre la espalda y las dems articulaciones, se fatigarn con mayor rapidez y tal vez le causen Social research officer, government.  POSICIONES DE DESCANSO Tenga en cuentas las posiciones que ms dolor le causan cuando elija una de descanso. Si las [employer] exigen flexionarse (sentarse, agacharse, encorvarse, [employer] en cuclillas) le Financial risk analyst, opte por una posicin que le permita descansar en una postura menos flexionada. No se curve en posicin fetal de costado. Si el dolor se intensifica con las [employer] exigen extenderse (estar de pie durante mucho tiempo, trabajar con las manos por encima de la cabeza), no descanse en una posicin extendida, por ejemplo, dormir boca abajo. La mayora de las personas estarn ms cmodas cuando descansen con la columna vertebral en una posicin ms neutral, ni muy curvada ni muy arqueada. Con frecuencia, se sentir ms aliviado si se acuesta de costado en una cama que no se hunda con una [employer], o boca arriba con una almohada debajo de las rodillas. Recuerde Sales promotion account executive en una sola posicin durante [employer], sin importar si la postura es [place], puede causar rigidez. CAMINAR [place] erguido. [place], los hombros y las caderas deben estar alineados. TRABAJO DE OFICINA Si trabaja en un escritorio, cree un entorno que le permita mantener una buena postura erguida. Sin soporte adicional, los msculos se fatigan y causan una tensin excesiva en las articulaciones y otros tejidos. SILLA:  La silla debe poder deslizarse por debajo del escritorio cuando apoye la espalda en el respaldo. Esto le permite trabajar ms cerca.  La altura de la silla debe permitirle que los ojos estn nivelados con la parte superior del monitor y las manos estn apenas ms abajo que los codos.  Posicin del  cuerpo:  Debe tener los pies apoyados en el piso. Si no es posible, use un posapies.  Mantenga las orejas por encima de los hombros. Esto reducir la tensin en el cuello y la cintura.   Esta informacin no tiene Marine scientist el consejo del mdico. Asegrese de hacerle al mdico cualquier pregunta que tenga.   Document Released: 09/30/2006 Document Revised: 01/04/2015 Elsevier Interactive Patient Education [employer].

## 2016-08-11 NOTE — Progress Notes (Signed)
Patient ID: Brianna Vega, female    DOB: [birth year], 40 y.o.   MRN: [id number]  PCP: Marijean Bravo, MD  Chief Complaint  Patient presents with  . facial swelluing    x 1 month    Subjective:   HPI Presents for evaluation of right sided facial pain with swelling 3-4 days.   She reports swelling of her right eye intermittently for about 4 months. No sore throat, pain is experienced on the right side of the face with rotation of head to the left. Localizes pain to the sternomastoid muscle of right side. She reports feeling a lump on the right side of her neck. No pain with palpation. Pain described as tension or tight sensation with rotation of neck. She experiences increased tightness with stress. 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   . Social History   Social History  . Marital status: Married    Spouse name: N/A  . Number of children: N/A  . Years of education: N/A   Occupational History  . Not on file.   Social History Main Topics  . Smoking status: Never Smoker  . Smokeless tobacco: Never Used  . Alcohol use Not on file  . Drug use: Unknown  . Sexual activity: Not on file   Other Topics Concern  . Not on file   Social History Narrative  . No narrative on file   .No family history on file.  Review of Systems  Constitutional: Negative.   HENT: Positive for facial swelling.        SEE HPI  Respiratory: Negative.   Cardiovascular: Negative.   Neurological: Negative.   Hematological: Negative.    There are no active problems to display for this patient.    Prior to Admission medications   Not on File     No Known Allergies     Objective:  Physical Exam  Constitutional: She is oriented to person, place, and time. She appears well-developed and well-nourished.  HENT:  Head: Normocephalic and atraumatic.  Right Ear: External ear normal.  Left Ear: External ear normal.   Mouth/Throat: Oropharynx is clear and moist.  Eyes: Pupils are equal, round, and reactive to light.  Neck: Normal range of motion. Neck supple. No thyromegaly present.  Left sided tension of sternocleidmastoid muscle with right rotational motion. Negative posterior and anterior adenopathy.Normal forward flexion and extension.  Normal strength against resistance.   Cardiovascular: Normal rate, regular rhythm and normal heart sounds.   Pulmonary/Chest: Effort normal and breath sounds normal.  Musculoskeletal: Normal range of motion.  Lymphadenopathy:    She has no cervical adenopathy.  Neurological: She is alert and oriented to person, place, and time.  Skin: Skin is warm and dry.  Psychiatric: She has a normal mood and affect. Her behavior is normal. Judgment and thought content normal.  . Vitals:   08/11/16 0924  BP: 98/68  Pulse: 82  Resp: 16  Temp: 98.4 F (36.9 C)     Assessment & Plan:  1. Neck pain on right side, likely related to improper body mechanics and patient appears to be experiencing some neck tension and or spasms. . . naproxen (NAPROSYN) 500 MG tablet    Sig: Take 1 tablet (500 mg total) by mouth 2 (two) times daily with a meal.  . cyclobenzaprine (FLEXERIL) 10 MG tablet    Sig: Take 1 tablet (10 mg total) by mouth  3 (three) times daily as needed for muscle spasms.   Follow-up if condition does not improve.  Carroll Sage. Kenton Kingfisher, MSN, FNP-C [facility]

## 2017-03-02 ENCOUNTER — Ambulatory Visit (INDEPENDENT_AMBULATORY_CARE_PROVIDER_SITE_OTHER): Payer: Self-pay | Admitting: Physician Assistant

## 2017-03-02 VITALS — BP 122/74 | HR 72 | Temp 98.4°F | Resp 18 | Ht <= 58 in | Wt 162.8 lb

## 2017-03-02 DIAGNOSIS — Z124 Encounter for screening for malignant neoplasm of cervix: Secondary | ICD-10-CM

## 2017-03-02 DIAGNOSIS — R079 Chest pain, unspecified: Secondary | ICD-10-CM

## 2017-03-02 DIAGNOSIS — Z13228 Encounter for screening for other metabolic disorders: Secondary | ICD-10-CM

## 2017-03-02 DIAGNOSIS — Z1329 Encounter for screening for other suspected endocrine disorder: Secondary | ICD-10-CM

## 2017-03-02 DIAGNOSIS — Z130 Encounter for screening for diseases of the blood and blood-forming organs and certain disorders involving the immune mechanism: Secondary | ICD-10-CM

## 2017-03-02 DIAGNOSIS — Z0000 Encounter for general adult medical examination without abnormal findings: Secondary | ICD-10-CM

## 2017-03-02 NOTE — Progress Notes (Addendum)
[facility] [address], [place] [address] [phone]  Date:  03/02/2017   Name:  Brianna Vega   DOB:  February 18, 1976   MRN:  [id number]  PCP:  Marijean Bravo, MD    History of Present Illness:  Brianna Vega is a 41 y.o. female patient who presents to [facility] for a physical exam.   She briefly discussed pelvic.  This was followed by a gynecologist 3 years ago at [facility]", without any significant findings. 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  Diet: normal. Eats without restriction.  She is not eating vegetables daily.  .5L of water per day. 1 sweet tea, and soda daily in addition to 1 cup of coffee.  BM: No black or blood stool.  Eating immediately going to the bathroom.      URINATION: normal, No dysuria, hematuria, or frequency.  SLEEP: sleeping normal, take 1 nap for hour sometimes during the day.   SOCIAL ACTIVITY:  Watch tv, and movies, playing on the phone.  No exercise. [employer] at work  Menses: normal.  Bleeds heavy during some months.  No pelvic cramping during the cycle.   2 20 and 9.  c-section. No other pregnancies.   No hx of std.  Sexually active with one partner.  Uses condoms daily.    Chest pains that occur when she is worried or concerned.  Feels pressure.  30 minutes.  There is no sob, palpitations, leg swelling, or vision changes. Feels like she is going to choke. Generally occurs during work. She at times feels overwhelmed with the amount of duties that she has. At the same time, she is doing a lot of exertional work when the chest pain occurs as well. Generally resolves with deep breathing and she tries to calm herself down and sit. Nonsmoker. Father with history of hypertension. No early cardiac arrest before the age of 12.    There are no active problems to display for this patient.   History reviewed. No pertinent past medical history.  Past Surgical History:  Procedure Laterality Date  . CESAREAN SECTION     11  and 22 years ago    Social History  Substance Use Topics  . Smoking status: Never Smoker  . Smokeless tobacco: Never Used  . Alcohol use Not on file    Family History  Problem Relation Age of Onset  . Hypertension Father   . Diabetes Paternal Aunt     No Known Allergies  Medication list has been reviewed and updated.  Current Outpatient Prescriptions on File Prior to Visit  Medication Sig Dispense Refill  . cyclobenzaprine (FLEXERIL) 10 MG tablet Take 1 tablet (10 mg total) by mouth 3 (three) times daily as needed for muscle spasms. 30 tablet 0  . naproxen (NAPROSYN) 500 MG tablet Take 1 tablet (500 mg total) by mouth 2 (two) times daily with a meal. 30 tablet 0   No current facility-administered medications on file prior to visit.     Review of Systems  Constitutional: Negative for chills and fever.  HENT: Negative for ear discharge, ear pain and sore throat.   Eyes: Negative for blurred vision and double vision.  Respiratory: Negative for cough, shortness of breath and wheezing.   Cardiovascular: Positive for chest pain. Negative for palpitations and leg swelling.  Gastrointestinal: Negative for diarrhea, nausea and vomiting.  Genitourinary: Negative for dysuria, frequency and hematuria.  Skin: Negative for itching and rash.  Neurological:  Negative for dizziness and headaches.     Physical Examination: BP 122/74 (BP Location: Right Arm, Patient Position: Sitting, Cuff Size: Small)   Pulse 72   Temp 98.4 F (36.9 C) (Oral)   Resp 18   Ht 4' 4.5" (1.334 m)   Wt 162 lb 12.8 oz (73.8 kg)   SpO2 100%   BMI 41.53 kg/m  Ideal Body Weight: Weight in (lb) to have BMI = 25: 97.8  Physical Exam  Constitutional: She is oriented to person, place, and time. She appears well-developed and well-nourished. No distress.  HENT:  Head: Normocephalic and atraumatic.  Right Ear: Tympanic membrane, external ear and ear canal normal.  Left Ear: Tympanic membrane, external ear and  ear canal normal.  Nose: Right sinus exhibits no maxillary sinus tenderness and no frontal sinus tenderness. Left sinus exhibits no maxillary sinus tenderness and no frontal sinus tenderness.  Mouth/Throat: Oropharynx is clear and moist. No uvula swelling. No oropharyngeal exudate, posterior oropharyngeal edema or posterior oropharyngeal erythema.  Eyes: Conjunctivae and EOM are normal. Pupils are equal, round, and reactive to light.  Neck: Normal range of motion. Neck supple. No thyromegaly present.  Cardiovascular: Normal rate, regular rhythm, normal heart sounds and intact distal pulses.  Exam reveals no gallop, no distant heart sounds and no friction rub.   No murmur heard. Pulmonary/Chest: Effort normal and breath sounds normal. No respiratory distress. She has no decreased breath sounds. She has no wheezes. She has no rhonchi.  Abdominal: Soft. Bowel sounds are normal. She exhibits no distension and no mass. There is no tenderness.  Genitourinary: Vagina normal and uterus normal. Pelvic exam was performed with patient supine. There is no rash on the right labia. There is no rash on the left labia. Cervix exhibits no motion tenderness, no discharge and no friability. Right adnexum displays no mass, no tenderness and no fullness. Left adnexum displays no mass, no tenderness and no fullness.  Musculoskeletal: Normal range of motion. She exhibits no edema or tenderness.  Lymphadenopathy:       Head (right side): No submandibular, no tonsillar, no preauricular and no posterior auricular adenopathy present.       Head (left side): No submandibular, no tonsillar, no preauricular and no posterior auricular adenopathy present.    She has no cervical adenopathy.  Neurological: She is alert and oriented to person, place, and time. No cranial nerve deficit. She exhibits normal muscle tone. Coordination normal.  Skin: Skin is warm and dry. She is not diaphoretic.  Psychiatric: She has a normal mood and  affect. Her behavior is normal.     Assessment and Plan: Brianna Vega is a 41 y.o. female who is here today for annual exam and chest pain. EKG was unremarkable. This was reviewed with Dr. Agustina Caroli.  Due to her age and chest pain symptoms, a consult with cardiology would be appreciated at this time.  Advised her to schedule her mammogram.  Annual physical exam - Plan: CBC, TSH, Comprehensive metabolic panel, EKG [id number], Pap IG w/ reflex to HPV when ASC-U  Chest pain, unspecified type - Plan: EKG 12-Lead, Ambulatory referral to Cardiology  Screening for deficiency anemia - Plan: CBC  Screening for thyroid disorder - Plan: TSH  Screening for metabolic disorder - Plan: Comprehensive metabolic panel  Screening for cervical cancer - Plan: Pap IG w/ reflex to HPV when ASC-U  Ivar Drape, PA-C [facility] 3/6/20183:54 PM

## 2017-03-02 NOTE — Patient Instructions (Addendum)
I am referring you to a cardiologist.  Please await this contact.  Your pap exam was fine, without palpable masses.  I will have your blood results shortly.  Use naproxen twice per day for the pelvic pain.  If you continue to have your pelvic symptoms, we will need to refer you to a gynecologist. You need to get a mammogram as well.  We recommend that you schedule a mammogram for breast cancer screening. Typically, you do not need a referral to do this. Please contact a local imaging center to schedule your mammogram.  [facility] - [phone]  *ask for the Radiology Department [facility] - [phone] or [phone]  [facility] - [phone] [place] [phone] [facility] - [phone]  *ask for the [facility] - [phone]  *ask for the Radiology Department [facility] - [phone]  *ask for the [place] - [phone]  Keeping You Healthy  Get These Tests 1. Blood Pressure- Have your blood pressure checked once a year by your health care provider.  Normal blood pressure is 120/80. 2. Weight- Have your body mass index (BMI) calculated to screen for obesity.  BMI is measure of body fat based on height and weight.  You can also calculate your own BMI at GravelBags.it. 3. Cholesterol- Have your cholesterol checked every 5 years starting at age 83 then yearly starting at age 65. 22. Chlamydia, HIV, and other sexually transmitted diseases- Get screened every year until age 80, then within three months of each new sexual provider. 5. Pap Test - Every 1-5 years; discuss with your health care provider. 6. Mammogram- Every 1-2 years starting at age 16--50  Take these medicines  Calcium with Vitamin D-Your body needs 1200 mg of Calcium each day and [phone] IU of Vitamin D daily.  Your body can only absorb 500  mg of Calcium at a time so Calcium must be taken in 2 or 3 divided doses throughout the day.  Multivitamin with folic acid- Once daily if it is possible for you to become pregnant.  Get these Immunizations  Gardasil-Series of three doses; prevents HPV related illness such as genital warts and cervical cancer.  Menactra-Single dose; prevents meningitis.  Tetanus shot- Every 10 years.  Flu shot-Every year.  Take these steps 1. Do not smoke-Your healthcare provider can help you quit.  For tips on how to quit go to www.smokefree.gov or call 1-800 QUITNOW. 2. Be physically active- Exercise 5 days a week for at least 30 minutes.  If you are not already physically active, start slow and gradually work up to 30 minutes of moderate physical activity.  Examples of moderate activity include walking briskly, dancing, swimming, bicycling, etc. 3. Breast Cancer- A self breast exam every month is important for early detection of breast cancer.  For more information and instruction on self breast exams, ask your healthcare provider or https://www.patel.info/. 4. Eat a healthy diet- Eat a variety of healthy foods such as fruits, vegetables, whole grains, low fat milk, low fat cheeses, yogurt, lean meats, poultry and fish, beans, nuts, tofu, etc.  For more information go to www. Thenutritionsource.org 5. Drink alcohol in moderation- Limit alcohol intake to one drink or less per day. Never drink and drive. 6. Depression- Your emotional health is as important as your physical health.  If you're feeling down or losing interest in things you normally enjoy  please talk to your healthcare provider about being screened for depression. 7. Dental visit- Brush and floss your teeth twice daily; visit your dentist twice a year. 8. Eye doctor- Get an eye exam at least every 2 years. 9. Helmet use- Always wear a helmet when riding a bicycle, motorcycle, rollerblading or skateboarding. 2. Safe sex- If  you may be exposed to sexually transmitted infections, use a condom. 11. Seat belts- Seat belts can save your live; always wear one. 12. Smoke/Carbon Monoxide detectors- These detectors need to be installed on the appropriate level of your home. Replace batteries at least once a year. 13. Skin cancer- When out in the sun please cover up and use sunscreen 15 SPF or higher. 14. Violence- If anyone is threatening or hurting you, please tell your healthcare provider.          IF you received an x-ray today, you will receive an invoice from [facility]. Please contact [facility] at [phone] with questions or concerns regarding your invoice.   IF you received labwork today, you will receive an invoice from [facility]. Please contact [facility] at [phone] with questions or concerns regarding your invoice.   Our billing staff will not be able to assist you with questions regarding bills from these companies.  You will be contacted with the lab results as soon as they are available. The fastest way to get your results is to activate your My Chart account. Instructions are located on the last page of this paperwork. If you have not heard from [place] regarding the results in 2 weeks, please contact this office.

## 2017-03-02 NOTE — Addendum Note (Signed)
Addended by: Ivar Drape D on: 03/02/2017 03:54 PM   Modules accepted: Orders

## 2017-03-03 LAB — COMPREHENSIVE METABOLIC PANEL
A/G RATIO: 1.7 (ref 1.2–2.2)
ALBUMIN: 4.5 g/dL (ref 3.5–5.5)
ALT: 26 IU/L (ref 0–32)
AST: 15 IU/L (ref 0–40)
Alkaline Phosphatase: 87 IU/L (ref 39–117)
BUN / CREAT RATIO: 17 (ref 9–23)
BUN: 11 mg/dL (ref 6–24)
Bilirubin Total: 0.3 mg/dL (ref 0.0–1.2)
CALCIUM: 9.5 mg/dL (ref 8.7–10.2)
CO2: 23 mmol/L (ref 18–29)
CREATININE: 0.65 mg/dL (ref 0.57–1.00)
Chloride: 102 mmol/L (ref 96–106)
GFR, EST AFRICAN AMERICAN: 127 mL/min/{1.73_m2} (ref >59)
GFR, EST NON AFRICAN AMERICAN: 110 mL/min/{1.73_m2} (ref >59)
GLOBULIN, TOTAL: 2.7 g/dL (ref 1.5–4.5)
Glucose: 88 mg/dL (ref 65–99)
POTASSIUM: 4.5 mmol/L (ref 3.5–5.2)
SODIUM: 138 mmol/L (ref 134–144)
Total Protein: 7.2 g/dL (ref 6.0–8.5)

## 2017-03-03 LAB — CBC
HEMATOCRIT: 40.8 % (ref 34.0–46.6)
Hemoglobin: 13.8 g/dL (ref 11.1–15.9)
MCH: 30.8 pg (ref 26.6–33.0)
MCHC: 33.8 g/dL (ref 31.5–35.7)
MCV: 91 fL (ref 79–97)
Platelets: 461 10*3/uL — ABNORMAL HIGH (ref 150–379)
RBC: 4.48 x10E6/uL (ref 3.77–5.28)
RDW: 14.4 % (ref 12.3–15.4)
WBC: 6.4 10*3/uL (ref 3.4–10.8)

## 2017-03-03 LAB — PAP IG W/ RFLX HPV ASCU: PAP SMEAR COMMENT: 0

## 2017-03-03 LAB — TSH: TSH: 1.59 u[IU]/mL (ref 0.450–4.500)

## 2018-03-09 ENCOUNTER — Ambulatory Visit: Payer: Self-pay | Admitting: Emergency Medicine

## 2018-03-09 ENCOUNTER — Encounter: Payer: Self-pay | Admitting: Emergency Medicine

## 2018-03-09 ENCOUNTER — Other Ambulatory Visit: Payer: Self-pay

## 2018-03-09 VITALS — BP 117/76 | HR 74 | Temp 98.2°F | Resp 16 | Ht 59.5 in | Wt 166.4 lb

## 2018-03-09 DIAGNOSIS — N926 Irregular menstruation, unspecified: Secondary | ICD-10-CM | POA: Insufficient documentation

## 2018-03-09 DIAGNOSIS — Z8742 Personal history of other diseases of the female genital tract: Secondary | ICD-10-CM | POA: Insufficient documentation

## 2018-03-09 DIAGNOSIS — R102 Pelvic and perineal pain: Secondary | ICD-10-CM

## 2018-03-09 LAB — POCT URINALYSIS DIP (MANUAL ENTRY)
BILIRUBIN UA: NEGATIVE
GLUCOSE UA: NEGATIVE mg/dL
Ketones, POC UA: NEGATIVE mg/dL
LEUKOCYTES UA: NEGATIVE
NITRITE UA: NEGATIVE
Protein Ur, POC: NEGATIVE mg/dL
RBC UA: NEGATIVE
Spec Grav, UA: 1.015 (ref 1.010–1.025)
UROBILINOGEN UA: 0.2 U/dL
pH, UA: 8.5 — AB (ref 5.0–8.0)

## 2018-03-09 LAB — POCT CBC
Granulocyte percent: 63.2 %G (ref 37–80)
HCT, POC: 37.6 % — AB (ref 37.7–47.9)
Hemoglobin: 12.8 g/dL (ref 12.2–16.2)
LYMPH, POC: 2 (ref 0.6–3.4)
MCH, POC: 30.8 pg (ref 27–31.2)
MCHC: 33.9 g/dL (ref 31.8–35.4)
MCV: 90.8 fL (ref 80–97)
MID (CBC): 0.5 (ref 0–0.9)
MPV: 7.2 fL (ref 0–99.8)
PLATELET COUNT, POC: 436 10*3/uL — AB (ref 142–424)
POC Granulocyte: 4.2 (ref 2–6.9)
POC LYMPH %: 30 % (ref 10–50)
POC MID %: 6.9 %M (ref 0–12)
RBC: 4.14 M/uL (ref 4.04–5.48)
RDW, POC: 14.2 %
WBC: 6.7 10*3/uL (ref 4.6–10.2)

## 2018-03-09 LAB — POCT URINE PREGNANCY: PREG TEST UR: NEGATIVE

## 2018-03-09 NOTE — Patient Instructions (Addendum)
     IF you received an x-ray today, you will receive an invoice from [facility]. Please contact [facility] at [phone] with questions or concerns regarding your invoice.   IF you received labwork today, you will receive an invoice from [facility]. Please contact [facility] at [phone] with questions or concerns regarding your invoice.   Our billing staff will not be able to assist you with questions regarding bills from these companies.  You will be contacted with the lab results as soon as they are available. The fastest way to get your results is to activate your My Chart account. Instructions are located on the last page of this paperwork. If you have not heard from us regarding the results in 2 weeks, please contact this office.      Dolor plvico en la mujer (Pelvic Pain, Female) El dolor plvico se percibe en la parte baja del abdomen, por debajo del ombligo y entre las caderas. El dolor puede comenzar de forma repentina (agudo), reaparecer (recurrente) o durar mucho tiempo (crnico). Se considera que el dolor plvico que dura ms de seis meses es crnico. El dolor plvico puede tener numerosas causas. A veces, la causa no se conoce. CUIDADOS EN EL HOGAR  Tome los medicamentos de venta libre y los recetados solamente como se lo haya indicado el mdico.  Haga reposo como se lo haya indicado el mdico.  No tenga relaciones sexuales si le causan dolor.  Lleve un registro del dolor plvico. Escriba los siguientes datos: ? Cundo comenz el dolor. ? La ubicacin del dolor. ? Qu cosas parecen aliviar o intensificar el dolor, como los alimentos o la menstruacin. ? Los sntomas que tiene junto con el dolor.  Concurra a todas las visitas de control como se lo haya indicado el mdico. Esto es importante. SOLICITE AYUDA SI:  Los medicamentos no le alivian el dolor.  El dolor reaparece.  Aparecen nuevos sntomas.  Tiene secrecin o sangrado vaginal  atpico.  Tiene fiebre o siente escalofros.  Tiene dificultad para defecar (estreimiento).  Observa sangre en la orina o en la materia fecal.  La orina tiene mal olor.  Se siente mareada o dbil. SOLICITE AYUDA DE INMEDIATO SI:  Siente un dolor repentino que es muy intenso.  El dolor contina empeorando.  El dolor es muy intenso y, adems, tiene alguno de los siguientes sntomas: ? Fiebre. ? Ganas de vomitar (nuseas). ? Vmitos. ? Suda mucho.  Se desmaya (pierde el conocimiento). Esta informacin no tiene como fin reemplazar el consejo del mdico. Asegrese de hacerle al mdico cualquier pregunta que tenga. Document Released: 06/14/2012 Document Revised: 01/04/2015 Document Reviewed: 10/04/2015 Elsevier Interactive Patient Education  2018 [employer]  

## 2018-03-09 NOTE — Progress Notes (Signed)
Brianna Vega 42 y.o.   Chief Complaint  Patient presents with  . Pelvic Pain    03/08/18  always when lifiting something heavy    HISTORY OF PRESENT ILLNESS: This is a 42 y.o. female complaining of left-sided pelvic intermittent sharp pain that started last night.  Has had similar episodes in the past.  Workup revealed ovarian cysts.  Also complaining of diminished menses for the past 3-4 menstrual cycles.  HPI   Prior to Admission medications   Medication Sig Start Date End Date Taking? Authorizing Provider  cyclobenzaprine (FLEXERIL) 10 MG tablet Take 1 tablet (10 mg total) by mouth 3 (three) times daily as needed for muscle spasms. Patient not taking: Reported on 03/09/2018 08/11/16   Scot Jun, FNP  naproxen (NAPROSYN) 500 MG tablet Take 1 tablet (500 mg total) by mouth 2 (two) times daily with a meal. Patient not taking: Reported on 03/09/2018 08/11/16   Scot Jun, FNP    No Known Allergies  There are no active problems to display for this patient.   No past medical history on file.    Social History   Socioeconomic History  . Marital status: Married    Spouse name: Not on file  . Number of children: Not on file  . Years of education: Not on file  . Highest education level: Not on file  Social Needs  . Financial resource strain: Not on file  . Food insecurity - worry: Not on file  . Food insecurity - inability: Not on file  . Transportation needs - medical: Not on file  . Transportation needs - non-medical: Not on file  Occupational History  . Not on file  Tobacco Use  . Smoking status: Never Smoker  . Smokeless tobacco: Never Used  Substance and Sexual Activity  . Alcohol use: Not on file  . Drug use: Not on file  . Sexual activity: Not on file  Other Topics Concern  . Not on file  Social History Narrative  . Not on file    Family History  Problem Relation Age of Onset  . Hypertension Father   . Diabetes Paternal Aunt       Review of Systems  Constitutional: Negative.  Negative for chills, fever and weight loss.  HENT: Negative.  Negative for sore throat.   Eyes: Negative.   Respiratory: Negative.  Negative for cough and shortness of breath.   Cardiovascular: Negative.  Negative for chest pain and palpitations.  Gastrointestinal: Negative.  Negative for abdominal pain, blood in stool, constipation, diarrhea, heartburn, melena, nausea and vomiting.  Genitourinary: Negative for dysuria and hematuria.       Pelvic pain  Skin: Negative.  Negative for rash.  Neurological: Negative.  Negative for dizziness and headaches.  Endo/Heme/Allergies: Negative.   All other systems reviewed and are negative.   Vitals:   03/09/18 1137  BP: 117/76  Pulse: 74  Resp: 16  Temp: 98.2 F (36.8 C)  SpO2: 98%    Physical Exam  Constitutional: She is oriented to person, place, and time. She appears well-developed and well-nourished.  HENT:  Head: Normocephalic and atraumatic.  Nose: Nose normal.  Mouth/Throat: Oropharynx is clear and moist.  Eyes: Conjunctivae and EOM are normal. Pupils are equal, round, and reactive to light.  Neck: Normal range of motion. Neck supple. No JVD present.  Cardiovascular: Normal rate, regular rhythm and normal heart sounds.  Pulmonary/Chest: Effort normal and breath sounds normal.  Abdominal: Soft. Bowel sounds are  normal. She exhibits no distension and no mass. There is no tenderness. There is no rebound and no guarding.  Musculoskeletal: Normal range of motion.  Lymphadenopathy:    She has no cervical adenopathy.  Neurological: She is alert and oriented to person, place, and time. No sensory deficit. She exhibits normal muscle tone.  Skin: Skin is warm and dry. Capillary refill takes less than 2 seconds. No rash noted.  Psychiatric: She has a normal mood and affect. Her behavior is normal.  Vitals reviewed.    Results for orders placed or performed in visit on 03/09/18 (from  the past 24 hour(s))  POCT CBC     Status: Abnormal   Collection Time: 03/09/18 12:30 PM  Result Value Ref Range   WBC 6.7 4.6 - 10.2 K/uL   Lymph, poc 2.0 0.6 - 3.4   POC LYMPH PERCENT 30.0 10 - 50 %L   MID (cbc) 0.5 0 - 0.9   POC MID % 6.9 0 - 12 %M   POC Granulocyte 4.2 2 - 6.9   Granulocyte percent 63.2 37 - 80 %G   RBC 4.14 4.04 - 5.48 M/uL   Hemoglobin 12.8 12.2 - 16.2 g/dL   HCT, POC 37.6 (A) 37.7 - 47.9 %   MCV 90.8 80 - 97 fL   MCH, POC 30.8 27 - 31.2 pg   MCHC 33.9 31.8 - 35.4 g/dL   RDW, POC 14.2 %   Platelet Count, POC 436 (A) 142 - 424 K/uL   MPV 7.2 0 - 99.8 fL  POCT urinalysis dipstick     Status: Abnormal   Collection Time: 03/09/18 12:32 PM  Result Value Ref Range   Color, UA yellow yellow   Clarity, UA clear clear   Glucose, UA negative negative mg/dL   Bilirubin, UA negative negative   Ketones, POC UA negative negative mg/dL   Spec Grav, UA 1.015 1.010 - 1.025   Blood, UA negative negative   pH, UA 8.5 (A) 5.0 - 8.0   Protein Ur, POC negative negative mg/dL   Urobilinogen, UA 0.2 0.2 or 1.0 E.U./dL   Nitrite, UA Negative Negative   Leukocytes, UA Negative Negative  POCT urine pregnancy     Status: None   Collection Time: 03/09/18 12:33 PM  Result Value Ref Range   Preg Test, Ur Negative Negative     ASSESSMENT & PLAN: Brianna Vega was seen today for pelvic pain.  Diagnoses and all orders for this visit:  Pelvic pain -     POCT CBC -     POCT urinalysis dipstick -     POCT urine pregnancy -     US Pelvis Complete; Future -     Ambulatory referral to Gynecology  Abnormal menses -     POCT CBC -     POCT urinalysis dipstick -     POCT urine pregnancy  History of ovarian cyst    Patient Instructions       IF you received an x-ray today, you will receive an invoice from [facility]. Please contact [facility] at [phone] with questions or concerns regarding your invoice.   IF you received labwork today, you will  receive an invoice from [place]. Please contact [facility] at [phone] with questions or concerns regarding your invoice.   Our billing staff will not be able to assist you with questions regarding bills from these companies.  You will be contacted with the lab results as soon as they are available. The fastest  way to get your results is to activate your My Chart account. Instructions are located on the last page of this paperwork. If you have not heard from [place] regarding the results in 2 weeks, please contact this office.     Dolor plvico en la mujer (Pelvic Pain, Female) El dolor plvico se percibe en la parte baja del abdomen, por debajo del ombligo y [employer]. El dolor puede comenzar de forma repentina (agudo), reaparecer (recurrente) o durar mucho tiempo (crnico). Se considera que el dolor plvico que dura ms de seis meses es crnico. El dolor plvico puede tener numerosas causas. A veces, la causa no se conoce. CUIDADOS EN EL HOGAR  Tome los medicamentos de venta libre y los recetados solamente como se lo haya indicado el mdico.  Haga reposo como se lo haya indicado el mdico.  No tenga relaciones sexuales si le causan dolor.  Lleve un registro del dolor plvico. Escriba los siguientes datos: ? [employer] Energy manager. ? La ubicacin del dolor. ? Qu cosas parecen Fish farm manager, como los alimentos o la [place]. ? Los sntomas que tiene junto con Conservation officer, historic buildings.  Concurra a todas las visitas de control como se lo haya indicado el mdico. Esto es importante. SOLICITE AYUDA SI:  Los medicamentos no Forensic psychologist.  El dolor reaparece.  Aparecen nuevos sntomas.  Tiene secrecin o sangrado vaginal atpico.  Tiene fiebre o siente escalofros.  Tiene dificultad para defecar (estreimiento).  Observa sangre en la orina o en la materia fecal.  La orina tiene mal olor.  Se siente mareada o dbil. SOLICITE AYUDA DE INMEDIATO SI:  Siente  un dolor repentino que es muy intenso.  El dolor contina empeorando.  El dolor es muy intenso y, [place], tiene alguno de los siguientes sntomas: ? Cristy Hilts. ? Ganas de vomitar (nuseas). ? Vmitos. ? Santo Held.  Se desmaya (pierde el conocimiento). Esta informacin no tiene Marine scientist el consejo del mdico. Asegrese de hacerle al mdico cualquier pregunta que tenga. Document Released: 06/14/2012 Document Revised: 01/04/2015 Document Reviewed: 10/04/2015 Elsevier Interactive Patient Education  2018 Elsevier Inc.      Agustina Caroli, MD [facility]

## 2018-03-22 ENCOUNTER — Telehealth: Payer: Self-pay | Admitting: Obstetrics and Gynecology

## 2018-03-22 ENCOUNTER — Other Ambulatory Visit: Payer: Self-pay | Admitting: Emergency Medicine

## 2018-03-22 ENCOUNTER — Encounter: Payer: Self-pay | Admitting: Radiology

## 2018-03-22 ENCOUNTER — Ambulatory Visit (HOSPITAL_COMMUNITY)
Admission: RE | Admit: 2018-03-22 | Discharge: 2018-03-22 | Disposition: A | Discharge status: Home / Self Care | Payer: Self-pay | Source: Ambulatory Visit | Attending: Emergency Medicine | Admitting: Emergency Medicine

## 2018-03-22 DIAGNOSIS — D259 Leiomyoma of uterus, unspecified: Secondary | ICD-10-CM | POA: Insufficient documentation

## 2018-03-22 DIAGNOSIS — R102 Pelvic and perineal pain: Secondary | ICD-10-CM | POA: Insufficient documentation

## 2018-03-22 NOTE — Telephone Encounter (Signed)
Called and left a message for patient to call back to schedule a new patient doctor referral appointment with our office for pelvic pain.

## 2018-03-24 NOTE — Telephone Encounter (Signed)
Called and left a message for patient to call back to schedule a new patient doctor referral appointment with our office for pelvic pain.

## 2018-03-29 NOTE — Telephone Encounter (Signed)
Called and left a message for patient to call back to schedule a new patient doctor referral appointment with our office for pelvic pain.

## 2019-04-11 IMAGING — US US PELVIS COMPLETE
1 series · 15 of 25 positions shown · non-contrast
Comparison: None.

CLINICAL DATA: Left pelvic pain

EXAM:
TRANSABDOMINAL ULTRASOUND OF PELVIS
TECHNIQUE: Transabdominal ultrasound examination of the pelvis was performed
including evaluation of the uterus, ovaries, adnexal regions, and
pelvic cul-de-sac.

[Series 1: us pelvis complete · 15 of 77 slices shown]
[im 1/77]
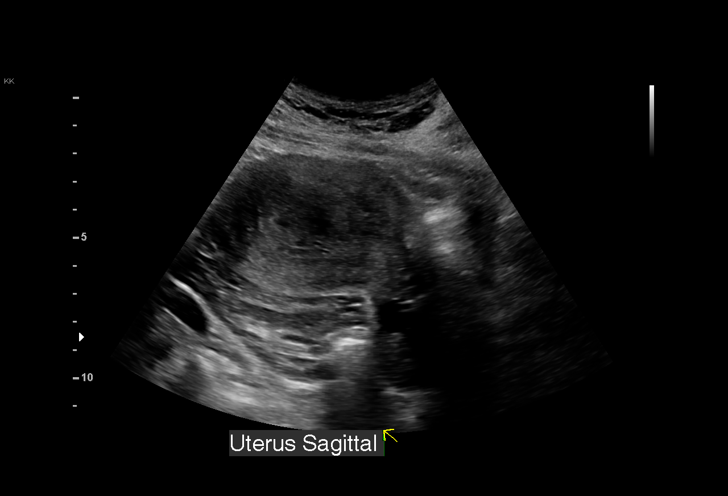
[im 7/77]
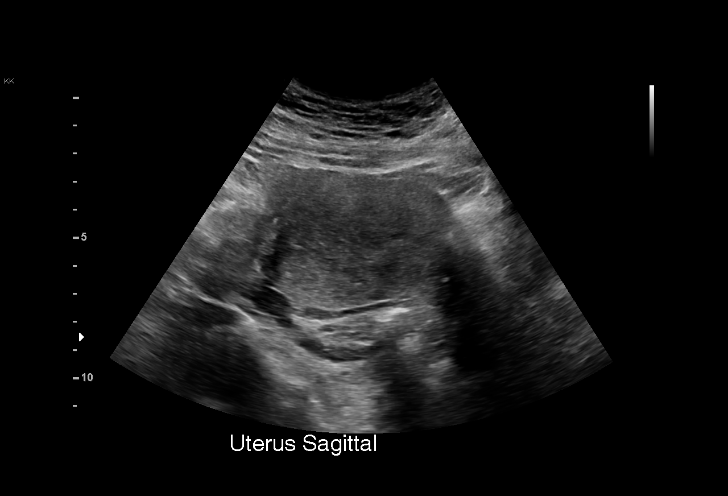
[im 13/77]
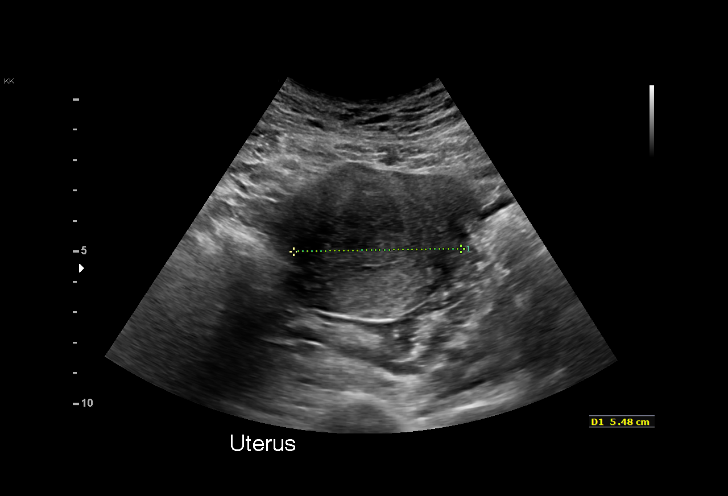
[im 16/77]
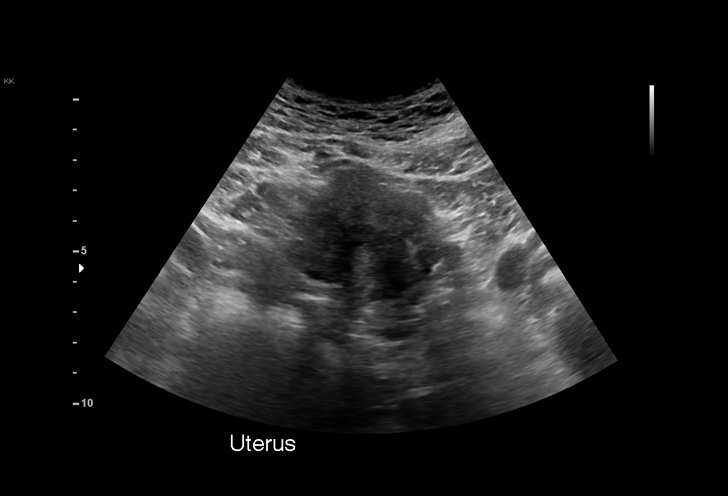
[im 23/77]
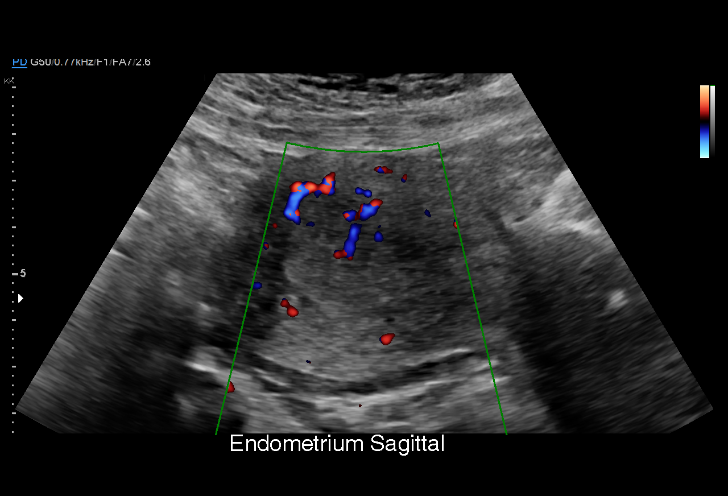
[im 29/77]
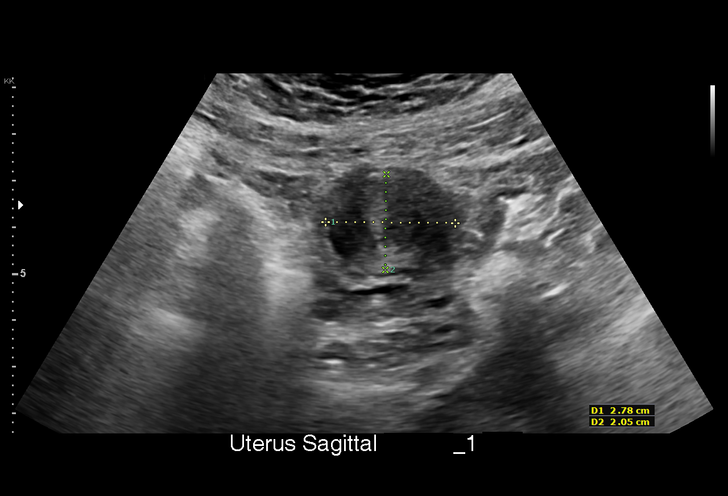
[im 32/77]
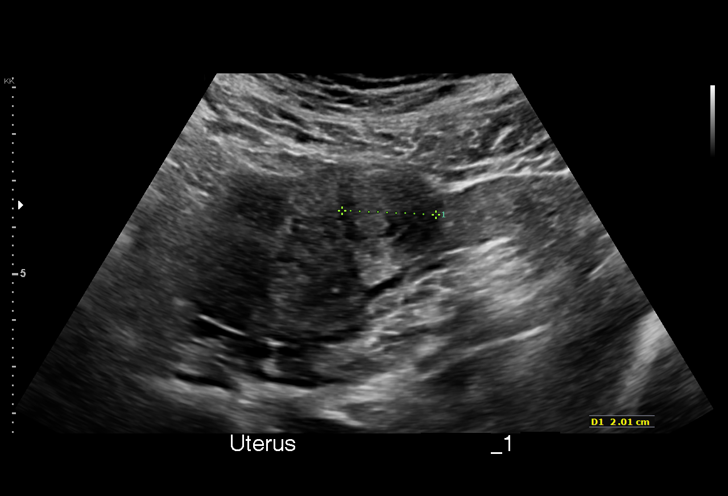
[im 39/77]
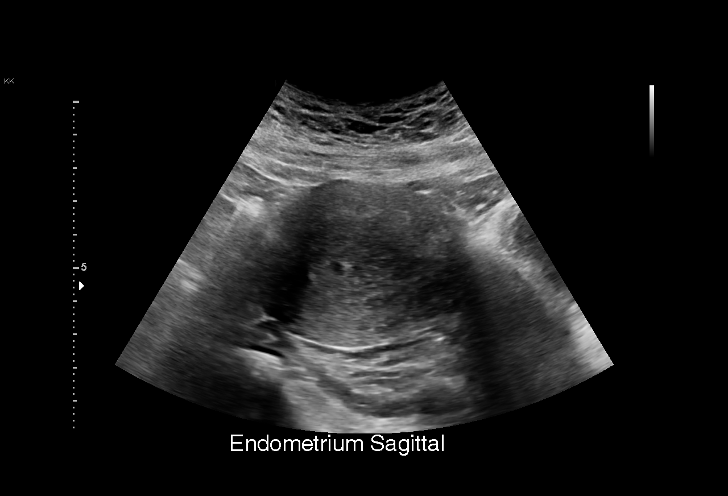
[im 45/77]
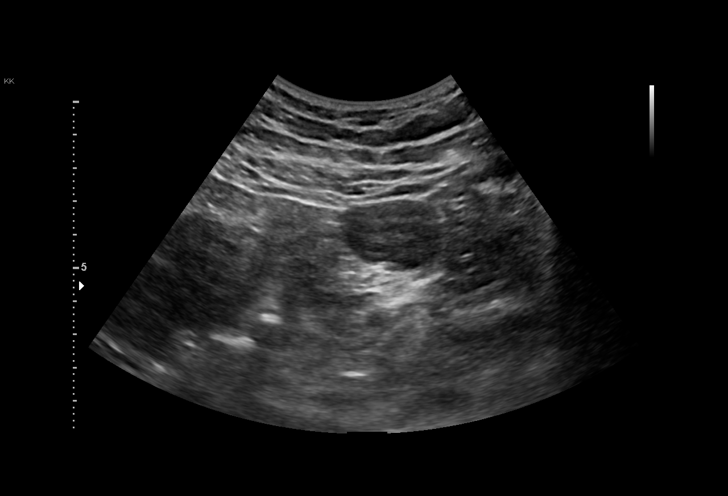
[im 48/77]
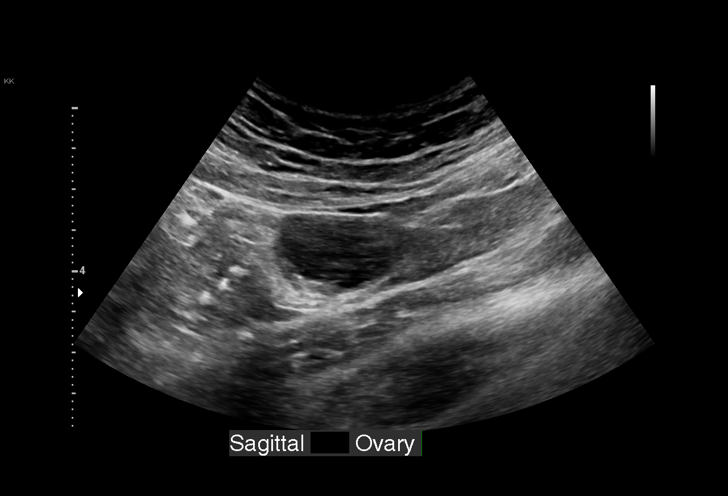
[im 54/77]
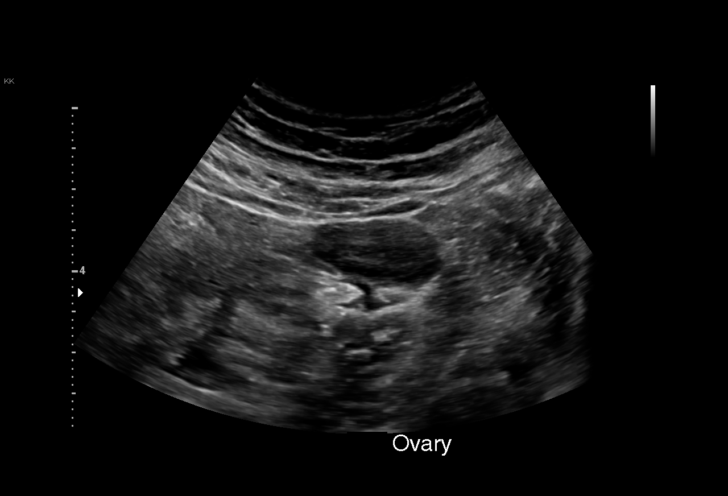
[im 61/77]
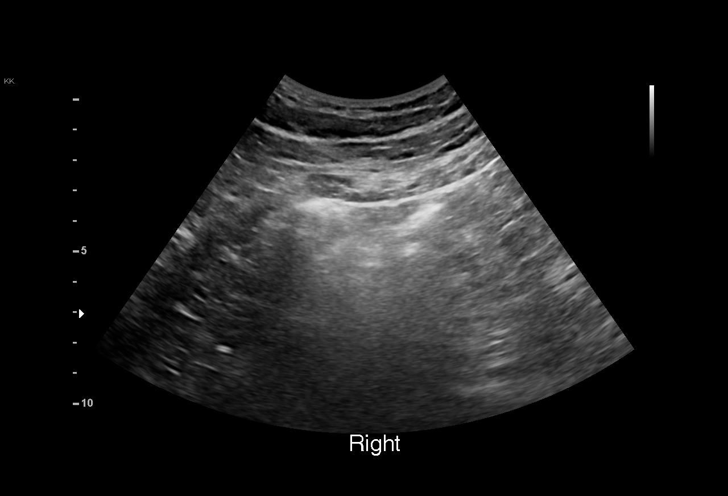
[im 64/77]
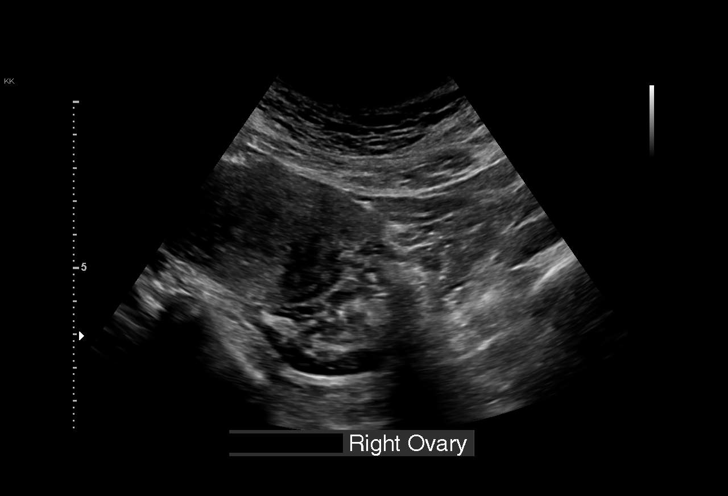
[im 70/77]
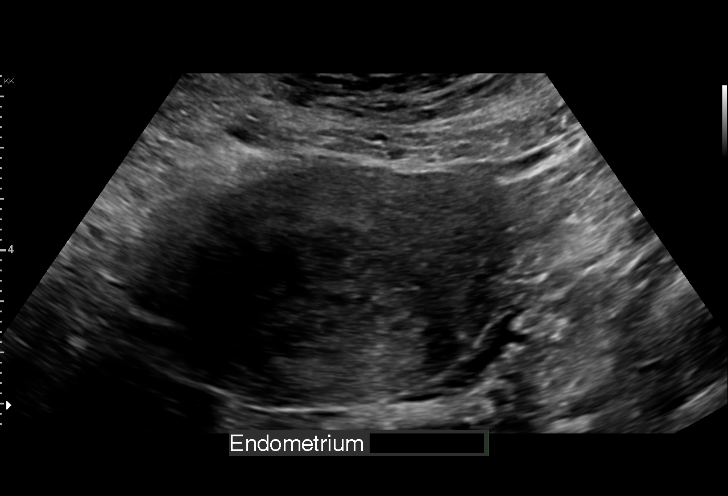
[im 77/77]
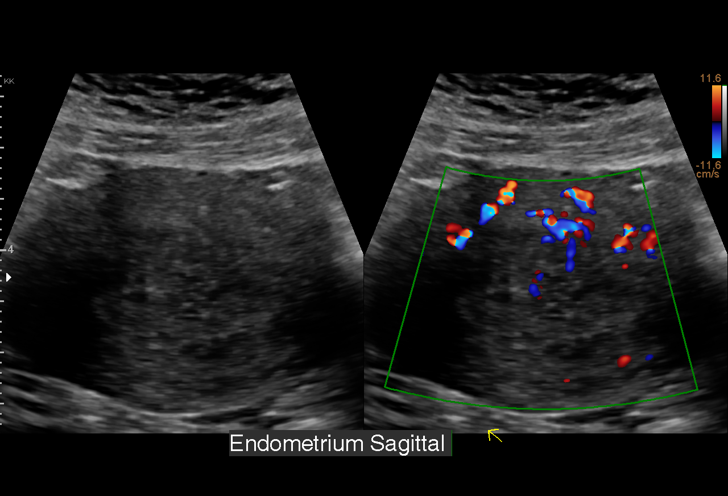

[15 of 25 positions shown; findings below may reference images not displayed]

FINDINGS: Uterus

Measurements: 10.3 x 4.8 x 5.5 cm. 2.8 cm anterior left fundal
fibroid

Endometrium

Thickness: Margins are indistinct, measuring 13 mm in thickness.
Heterogeneous appearance with cystic areas.

Right ovary

Measurements: 4.0 x 2.2 x 2.7 cm. Normal appearance/no adnexal mass.

Left ovary

Measurements: 3.4 x 2.0 x 3.3 cm. Normal appearance/no adnexal mass.

Other findings:  No abnormal free fluid.
IMPRESSION: Indistinct, heterogeneous endometrium. Small scattered cystic spaces
in the endometrium. Consider follow-up in 3-6 months during the week
immediately following menses.

2.8 cm anterior left fundal fibroid.

## 2020-01-11 ENCOUNTER — Ambulatory Visit (INDEPENDENT_AMBULATORY_CARE_PROVIDER_SITE_OTHER): Payer: Self-pay | Admitting: Emergency Medicine

## 2020-01-11 ENCOUNTER — Other Ambulatory Visit: Payer: Self-pay

## 2020-01-11 ENCOUNTER — Encounter: Payer: Self-pay | Admitting: Emergency Medicine

## 2020-01-11 VITALS — BP 108/72 | HR 90 | Temp 98.1°F | Resp 16 | Ht 59.5 in | Wt 169.0 lb

## 2020-01-11 DIAGNOSIS — K602 Anal fissure, unspecified: Secondary | ICD-10-CM

## 2020-01-11 DIAGNOSIS — Z23 Encounter for immunization: Secondary | ICD-10-CM

## 2020-01-11 DIAGNOSIS — K625 Hemorrhage of anus and rectum: Secondary | ICD-10-CM

## 2020-01-11 MED ORDER — HYDROCORTISONE (PERIANAL) 2.5 % EX CREA: 1.0000 "application " | TOPICAL_CREAM | Freq: Two times a day (BID) | CUTANEOUS | 0 refills | Status: AC

## 2020-01-11 NOTE — Patient Instructions (Addendum)
   If you have lab work done today you will be contacted with your lab results within the next 2 weeks.  If you have not heard from us then please contact us. The fastest way to get your results is to register for My Chart.   IF you received an x-ray today, you will receive an invoice from [facility]. Please contact [facility] at [phone] with questions or concerns regarding your invoice.   IF you received labwork today, you will receive an invoice from [facility]. Please contact [facility] at [phone] with questions or concerns regarding your invoice.   Our billing staff will not be able to assist you with questions regarding bills from these companies.  You will be contacted with the lab results as soon as they are available. The fastest way to get your results is to activate your My Chart account. Instructions are located on the last page of this paperwork. If you have not heard from us regarding the results in 2 weeks, please contact this office.     Fisura anal en los adultos Anal Fissure, Adult  Una fisura anal es un pequeo desgarro o un corte en el tejido que rodea el orificio entre las nalgas (ano). El sangrado proveniente del desgarro o el corte suele detenerse solo despus de algunos minutos. Es probable que note sangre en la materia fecal, hasta tanto el desgarro o el corte cicatricen. Cules son las causas? Generalmente, la causa de esta afeccin es la evacuacin de deposiciones (heces) duras o voluminosas. Otras causas son:  Dificultad para defecar (estreimiento).  Tener deposiciones lquidas (diarrea).  Enfermedad inflamatoria del intestino (enfermedad de Crohn o colitis ulcerosa).  Parto.  Infecciones.  Sexo anal. Cules son los signos o los sntomas? Los sntomas de esta afeccin incluyen:  Sangrado proveniente del ano.  Pequeas cantidades de sangre en las deposiciones. La sangre recubre el exterior de las deposiciones. No est  mezclada con las deposiciones.  Pequeas cantidades de sangre en el papel higinico o en el inodoro despus de mover el intestino.  Dolor al mover el intestino.  Picazn o irritacin alrededor del orificio entre las nalgas. Cmo se diagnostica? Esta afeccin se puede diagnosticar con un examen fsico. El mdico puede:  Revisarle el ano. Un desgarro generalmente puede verse al revisar la zona con atencin.  Revisarle el ano usando un tubo corto (anoscopio). La luz en el tubo mostrar si hay problemas en el ano. Cmo se trata? El tratamiento de esta afeccin puede incluir lo siguiente:  Tratar los problemas que hacen que le sea difcil mover el intestino. Es posible que le indiquen lo siguiente: ? Consumir ms fibra. ? Beber ms lquido. ? Tomar suplementos de fibra. ? Tomar medicamentos que ablandan las deposiciones.  Tomar baos de asiento. Esto puede ayudar a que el desgarro cicatrice.  Utilizar cremas y ungentos. Si la afeccin empeora, puede ser necesario recurrir a otros tratamientos, por ejemplo:  Una inyeccin cerca del desgarro o corte (inyeccin de toxina botulnica).  Ciruga para reparar el desgarro o corte. Siga estas indicaciones en su casa: Comida y bebida   No consuma bananas ni productos lcteos. Estos alimentos pueden causar estreimiento.  Beba suficiente lquido para mantener el pis (la orina) de color amarillo plido.  Consuma alimentos que contengan mucha fibra, por ejemplo: ? Frijoles. ? Cereales integrales. ? Frutas frescas. ? Verduras frescas. Indicaciones generales   Tome los medicamentos de venta libre y los recetados solamente como se lo haya indicado el mdico.    Use cremas o ungentos solamente como se lo haya indicado el mdico.  Mantenga la zona anal tan limpia y seca como sea posible.  Dese un bao de agua tibia (bao de asiento) como se lo haya indicado el mdico. No use jabn para limpiar la zona afectada.  Concurra a todas las  visitas de control como se lo haya indicado el mdico. Esto es importante. Comunquese con un mdico si:  Aumenta el sangrado.  Tiene fiebre.  Tiene heces acuosas mezcladas con sangre.  Siente dolor.  Su problema empeora, en lugar de mejorar. Resumen  Una fisura anal es un pequeo desgarro o un corte en la piel que rodea el orificio del conducto anal (ano).  Generalmente, la causa de esta afeccin es la evacuacin de deposiciones (heces) duras o voluminosas.  El tratamiento incluye tratar los problemas que hacen que le sea difcil mover el intestino.  Siga las indicaciones del mdico acerca del cuidado de su afeccin en su casa.  Concurra a todas las visitas de control como se lo haya indicado el mdico. Esto es importante. Esta informacin no tiene como fin reemplazar el consejo del mdico. Asegrese de hacerle al mdico cualquier pregunta que tenga. Document Revised: 07/25/2018 Document Reviewed: 07/25/2018 Elsevier Patient Education  2020 Elsevier Inc.  

## 2020-01-11 NOTE — Progress Notes (Signed)
Brianna Vega 44 y.o.   Chief Complaint  Patient presents with  . Abdominal Pain    used restroom 2 days ago and noticed blood from rectum    HISTORY OF PRESENT ILLNESS: This is a 44 y.o. female complaining of blood in the rectal area that she noticed 2 days ago.  Intermittent bleeding with occasional discomfort.  No history of hemorrhoids.  No other significant symptoms.   HPI   Prior to Admission medications   Medication Sig Start Date End Date Taking? Authorizing Provider  OVER THE COUNTER MEDICATION    Yes [provider]  OVER THE COUNTER MEDICATION    Yes [provider]  cyclobenzaprine (FLEXERIL) 10 MG tablet Take 1 tablet (10 mg total) by mouth 3 (three) times daily as needed for muscle spasms. Patient not taking: Reported on 01/11/2020 08/11/16   Scot Jun, FNP  naproxen (NAPROSYN) 500 MG tablet Take 1 tablet (500 mg total) by mouth 2 (two) times daily with a meal. Patient not taking: Reported on 01/11/2020 08/11/16   Scot Jun, FNP    No Known Allergies  Patient Active Problem List   Diagnosis Date Noted  . History of ovarian cyst 03/09/2018    History reviewed. No pertinent past medical history.  Past Surgical History:  Procedure Laterality Date  . CESAREAN SECTION     11 and 22 years ago    Social History   Socioeconomic History  . Marital status: Married    Spouse name: Not on file  . Number of children: Not on file  . Years of education: Not on file  . Highest education level: Not on file  Occupational History  . Not on file  Tobacco Use  . Smoking status: Never Smoker  . Smokeless tobacco: Never Used  Substance and Sexual Activity  . Alcohol use: Not on file  . Drug use: Not on file  . Sexual activity: Not on file  Other Topics Concern  . Not on file  Social History Narrative  . Not on file   Social Determinants of Health   Financial Resource Strain:   . Difficulty of Paying Living Expenses: Not on file   Food Insecurity:   . Worried About Charity fundraiser in the Last Year: Not on file  . Ran Out of Food in the Last Year: Not on file  Transportation Needs:   . Lack of Transportation (Medical): Not on file  . Lack of Transportation (Non-Medical): Not on file  Physical Activity:   . Days of Exercise per Week: Not on file  . Minutes of Exercise per Session: Not on file  Stress:   . Feeling of Stress : Not on file  Social Connections:   . Frequency of Communication with Friends and Family: Not on file  . Frequency of Social Gatherings with Friends and Family: Not on file  . Attends Religious Services: Not on file  . Active Member of Clubs or Organizations: Not on file  . Attends Archivist Meetings: Not on file  . Marital Status: Not on file  Intimate Partner Violence:   . Fear of Current or Ex-Partner: Not on file  . Emotionally Abused: Not on file  . Physically Abused: Not on file  . Sexually Abused: Not on file    Family History  Problem Relation Age of Onset  . Hypertension Father   . Diabetes Paternal Aunt      Review of Systems  Constitutional: Negative.  Negative for chills and fever.  HENT: Negative.  Negative for congestion and sore throat.   Respiratory: Negative.  Negative for cough and shortness of breath.   Cardiovascular: Negative.  Negative for chest pain and palpitations.  Gastrointestinal: Negative.  Negative for abdominal pain, diarrhea, melena, nausea and vomiting.       Rectal bleeding  Genitourinary: Negative.  Negative for dysuria and hematuria.  Skin: Negative.  Negative for rash.  Neurological: Negative.  Negative for dizziness, loss of consciousness and headaches.  Endo/Heme/Allergies: Negative.   All other systems reviewed and are negative.   Today's Vitals   01/11/20 1052  BP: 108/72  Pulse: 90  Resp: 16  Temp: 98.1 F (36.7 C)  TempSrc: Temporal  SpO2: 98%  Weight: 169 lb (76.7 kg)  Height: 4' 11.5" (1.511 m)   Body mass  index is 33.56 kg/m.   Physical Exam Vitals reviewed.  Constitutional:      Appearance: She is well-developed.  HENT:     Head: Normocephalic.  Eyes:     Extraocular Movements: Extraocular movements intact.     Pupils: Pupils are equal, round, and reactive to light.  Cardiovascular:     Rate and Rhythm: Normal rate.  Pulmonary:     Effort: Pulmonary effort is normal.  Abdominal:     Palpations: Abdomen is soft.     Tenderness: There is no abdominal tenderness.  Genitourinary:    Rectum: Tenderness and anal fissure present. No mass or internal hemorrhoid. Normal anal tone.  Musculoskeletal:        General: Normal range of motion.     Cervical back: Normal range of motion.  Skin:    General: Skin is warm and dry.     Capillary Refill: Capillary refill takes less than 2 seconds.  Neurological:     General: No focal deficit present.     Mental Status: She is alert and oriented to person, place, and time.  Psychiatric:        Mood and Affect: Mood normal.        Behavior: Behavior normal.      ASSESSMENT & PLAN: Brianna Vega was seen today for abdominal pain.  Diagnoses and all orders for this visit:  Anal fissure  Need for prophylactic vaccination and inoculation against influenza -     Flu Vaccine QUAD 36+ mos IM  Need for Tdap vaccination -     Tdap vaccine greater than or equal to 7yo IM  Rectal bleeding    Patient Instructions       If you have lab work done today you will be contacted with your lab results within the next 2 weeks.  If you have not heard from [place] then please contact us. The fastest way to get your results is to register for My Chart.   IF you received an x-ray today, you will receive an invoice from [facility]. Please contact [facility] at [phone] with questions or concerns regarding your invoice.   IF you received labwork today, you will receive an invoice from [facility]. Please contact [facility] at [phone] with  questions or concerns regarding your invoice.   Our billing staff will not be able to assist you with questions regarding bills from these companies.  You will be contacted with the lab results as soon as they are available. The fastest way to get your results is to activate your My Chart account. Instructions are located on the last page of this paperwork. If you have not  heard from [place] regarding the results in 2 weeks, please contact this office.     Fisura anal en los adultos Anal Fissure, Adult  Una fisura anal es un pequeo desgarro o un corte en el tejido que rodea el orificio entre las nalgas (ano). El sangrado proveniente del desgarro o el corte suele detenerse solo despus de algunos minutos. Es probable que note Pharmacologist materia fecal, hasta tanto el desgarro o el corte cicatricen. Cules son las causas? Generalmente, la causa de esta afeccin es la evacuacin de deposiciones (heces) duras o voluminosas. Otras causas son:  Dificultad para defecar (estreimiento).  Tener deposiciones lquidas (diarrea).  Enfermedad inflamatoria del intestino (enfermedad de Crohn o colitis ulcerosa).  Parto.  Infecciones.  Sexo anal. Cules son los signos o los sntomas? Los sntomas de esta afeccin incluyen:  Sangrado proveniente del ano.  Pequeas cantidades de [employer]. La sangre recubre el exterior de las deposiciones. No est mezclada con las deposiciones.  Pequeas cantidades de sangre en el papel higinico o en el inodoro despus de mover el intestino.  Dolor al Film/video editor intestino.  Picazn o irritacin alrededor del [employer]. Cmo se diagnostica? Esta afeccin se puede diagnosticar con un examen fsico. El mdico puede:  Revisarle el ano. Un desgarro generalmente puede verse al revisar la zona con atencin.  Revisarle el ano usando un tubo corto (anoscopio). La luz en el tubo mostrar si hay problemas en el ano. Cmo se trata? El  tratamiento de esta afeccin puede incluir lo siguiente:  Risk manager los problemas que hacen que le sea difcil mover el intestino. Es posible que le indiquen lo siguiente: ? Consumir ms fibra. ? Beber ms lquido. ? Tomar suplementos de [place]. ? Tomar medicamentos que ablandan las deposiciones.  Tomar baos de asiento. Esto puede ayudar a Marine scientist.  Utilizar cremas y ungentos. Si la afeccin empeora, puede ser necesario recurrir a otros tratamientos, por ejemplo:  Una inyeccin cerca del desgarro o corte (inyeccin de toxina botulnica).  Ciruga para reparar el desgarro o corte. Siga estas indicaciones en su casa: Comida y bebida   No consuma bananas ni productos lcteos. Estos alimentos pueden causar estreimiento.  Beba suficiente lquido para Contractor pis (la orina) de color amarillo plido.  Consuma alimentos que contengan mucha fibra, por ejemplo: ? Frijoles. ? Cereales integrales. ? Frutas frescas. ? Verduras frescas. Indicaciones generales   [employer] de venta libre y los recetados solamente como se lo haya indicado el mdico.  Use cremas o ungentos solamente como se lo haya indicado el mdico.  Mantenga la zona anal tan limpia y seca como sea posible.  Dese un bao de agua tibia (bao de asiento) como se lo haya indicado el mdico. No use jabn para limpiar la zona afectada.  Concurra a todas las visitas de control como se lo haya indicado el mdico. Esto es importante. Comunquese con un mdico si:  Aumenta el sangrado.  Tiene fiebre.  Tiene heces acuosas mezcladas con sangre.  Siente dolor.  Su problema empeora, en lugar de mejorar. Resumen  Una fisura anal es un pequeo desgarro o un corte en la piel que rodea el orificio del conducto anal (ano).  Generalmente, la causa de esta afeccin es la evacuacin de deposiciones (heces) duras o voluminosas.  El tratamiento incluye tratar los problemas que hacen que le sea  difcil mover el intestino.  Siga las indicaciones del mdico acerca del cuidado de [place]  afeccin en su casa.  Concurra a todas las visitas de control como se lo haya indicado el mdico. Esto es importante. Esta informacin no tiene Marine scientist el consejo del mdico. Asegrese de hacerle al mdico cualquier pregunta que tenga. Document Revised: 07/25/2018 Document Reviewed: 07/25/2018 Elsevier Patient Education  2020 Elsevier Inc.      Agustina Caroli, MD [facility]

## 2020-01-22 ENCOUNTER — Other Ambulatory Visit (HOSPITAL_COMMUNITY)
Admission: RE | Admit: 2020-01-22 | Discharge: 2020-01-22 | Disposition: A | Discharge status: Home / Self Care | Payer: Self-pay | Source: Ambulatory Visit | Attending: Emergency Medicine | Admitting: Emergency Medicine

## 2020-01-22 ENCOUNTER — Other Ambulatory Visit: Payer: Self-pay

## 2020-01-22 ENCOUNTER — Encounter: Payer: Self-pay | Admitting: Emergency Medicine

## 2020-01-22 ENCOUNTER — Ambulatory Visit (INDEPENDENT_AMBULATORY_CARE_PROVIDER_SITE_OTHER): Payer: Self-pay | Admitting: Emergency Medicine

## 2020-01-22 VITALS — BP 104/70 | HR 87 | Temp 98.1°F | Wt 172.8 lb

## 2020-01-22 DIAGNOSIS — Z130 Encounter for screening for diseases of the blood and blood-forming organs and certain disorders involving the immune mechanism: Secondary | ICD-10-CM

## 2020-01-22 DIAGNOSIS — Z124 Encounter for screening for malignant neoplasm of cervix: Secondary | ICD-10-CM | POA: Insufficient documentation

## 2020-01-22 DIAGNOSIS — Z13228 Encounter for screening for other metabolic disorders: Secondary | ICD-10-CM

## 2020-01-22 DIAGNOSIS — Z13220 Encounter for screening for lipoid disorders: Secondary | ICD-10-CM

## 2020-01-22 DIAGNOSIS — Z0000 Encounter for general adult medical examination without abnormal findings: Secondary | ICD-10-CM

## 2020-01-22 DIAGNOSIS — Z1329 Encounter for screening for other suspected endocrine disorder: Secondary | ICD-10-CM

## 2020-01-22 NOTE — Progress Notes (Signed)
Arbutus Ped Karg 44 y.o.   Chief Complaint  Patient presents with  . Annual Exam    here for annual physical    HISTORY OF PRESENT ILLNESS: This is a 44 y.o. female here today for her annual exam. No chronic medical problems.  No chronic medications. Healthy female with a healthy lifestyle. Non-smoker.  No EtOH user. Active lifestyle.  HPI   Prior to Admission medications   Medication Sig Start Date End Date Taking? Authorizing Provider  hydrocortisone (ANUSOL-HC) 2.5 % rectal cream Place 1 application rectally 2 (two) times daily. 01/11/20  Yes Elisheva Fallas, Ines Bloomer, MD  OVER THE COUNTER MEDICATION    Yes [provider]  OVER THE COUNTER MEDICATION    Yes [provider]    No Known Allergies  Patient Active Problem List   Diagnosis Date Noted  . History of ovarian cyst 03/09/2018    History reviewed. No pertinent past medical history.  Past Surgical History:  Procedure Laterality Date  . CESAREAN SECTION     11 and 22 years ago    Social History   Socioeconomic History  . Marital status: Married    Spouse name: Not on file  . Number of children: Not on file  . Years of education: Not on file  . Highest education level: Not on file  Occupational History  . Not on file  Tobacco Use  . Smoking status: Never Smoker  . Smokeless tobacco: Never Used  Substance and Sexual Activity  . Alcohol use: Not on file  . Drug use: Not on file  . Sexual activity: Not on file  Other Topics Concern  . Not on file  Social History Narrative  . Not on file   Social Determinants of Health   Financial Resource Strain:   . Difficulty of Paying Living Expenses: Not on file  Food Insecurity:   . Worried About Charity fundraiser in the Last Year: Not on file  . Ran Out of Food in the Last Year: Not on file  Transportation Needs:   . Lack of Transportation (Medical): Not on file  . Lack of Transportation (Non-Medical): Not on file  Physical Activity:   .  Days of Exercise per Week: Not on file  . Minutes of Exercise per Session: Not on file  Stress:   . Feeling of Stress : Not on file  Social Connections:   . Frequency of Communication with Friends and Family: Not on file  . Frequency of Social Gatherings with Friends and Family: Not on file  . Attends Religious Services: Not on file  . Active Member of Clubs or Organizations: Not on file  . Attends Archivist Meetings: Not on file  . Marital Status: Not on file  Intimate Partner Violence:   . Fear of Current or Ex-Partner: Not on file  . Emotionally Abused: Not on file  . Physically Abused: Not on file  . Sexually Abused: Not on file    Family History  Problem Relation Age of Onset  . Hypertension Father   . Diabetes Paternal Aunt      Review of Systems  Constitutional: Negative.  Negative for chills and fever.  HENT: Negative.  Negative for congestion and sore throat.   Respiratory: Negative.  Negative for cough and shortness of breath.   Cardiovascular: Negative.  Negative for chest pain and palpitations.  Gastrointestinal: Negative.  Negative for abdominal pain, blood in stool, diarrhea, melena, nausea and vomiting.  Genitourinary: Negative.  Negative for dysuria and hematuria.  Musculoskeletal: Negative.  Negative for back pain, myalgias and neck pain.  Skin: Negative.   Neurological: Negative.  Negative for dizziness and headaches.  All other systems reviewed and are negative.  Today's Vitals   01/22/20 1353  BP: 104/70  Pulse: 87  Temp: 98.1 F (36.7 C)  TempSrc: Oral  SpO2: 98%  Weight: 172 lb 12.8 oz (78.4 kg)   Body mass index is 34.32 kg/m.   Physical Exam Vitals reviewed. Exam conducted with a chaperone present.  Constitutional:      Appearance: Normal appearance.  HENT:     Head: Normocephalic.  Eyes:     Extraocular Movements: Extraocular movements intact.     Conjunctiva/sclera: Conjunctivae normal.     Pupils: Pupils are equal,  round, and reactive to light.  Cardiovascular:     Rate and Rhythm: Normal rate and regular rhythm.     Pulses: Normal pulses.     Heart sounds: Normal heart sounds.  Pulmonary:     Effort: Pulmonary effort is normal.     Breath sounds: Normal breath sounds.  Chest:     Breasts:        Right: Normal.        Left: Normal.  Abdominal:     General: Bowel sounds are normal. There is no distension.     Palpations: Abdomen is soft. There is no mass.     Tenderness: There is no abdominal tenderness.     Hernia: There is no hernia in the left inguinal area or right inguinal area.  Genitourinary:    General: Normal vulva.     Labia:        Right: No rash, tenderness or lesion.        Left: No rash, tenderness or lesion.      Vagina: Normal. No vaginal discharge, erythema, tenderness, bleeding or lesions.     Cervix: No cervical motion tenderness, discharge, lesion, erythema or cervical bleeding.     Adnexa: Right adnexa normal and left adnexa normal.  Musculoskeletal:        General: Normal range of motion.     Cervical back: Normal range of motion and neck supple.  Lymphadenopathy:     Upper Body:     Right upper body: No axillary adenopathy.     Left upper body: No axillary adenopathy.     Lower Body: No right inguinal adenopathy. No left inguinal adenopathy.  Skin:    General: Skin is warm and dry.     Capillary Refill: Capillary refill takes less than 2 seconds.  Neurological:     General: No focal deficit present.     Mental Status: She is alert and oriented to person, place, and time.  Psychiatric:        Mood and Affect: Mood normal.        Behavior: Behavior normal.      ASSESSMENT & PLAN: Telisa was seen today for annual exam.  Diagnoses and all orders for this visit:  Routine general medical examination at a health care facility  Screening for deficiency anemia -     CBC with Differential/Platelet  Screening for lipoid disorders -     Lipid Panel  Screening  for endocrine, metabolic and immunity disorder -     Cancel: COMPLETE METABOLIC PANEL WITH GFR -     Hemoglobin A1c -     CMP14+EGFR  Cervical cancer screening -     Cytology - PAP ([place])  Patient Instructions       If you have lab work done today you will be contacted with your lab results within the next 2 weeks.  If you have not heard from [place] then please contact us. The fastest way to get your results is to register for My Chart.   IF you received an x-ray today, you will receive an invoice from [facility]. Please contact [facility] at [phone] with questions or concerns regarding your invoice.   IF you received labwork today, you will receive an invoice from [place]. Please contact [facility] at [phone] with questions or concerns regarding your invoice.   Our billing staff will not be able to assist you with questions regarding bills from these companies.  You will be contacted with the lab results as soon as they are available. The fastest way to get your results is to activate your My Chart account. Instructions are located on the last page of this paperwork. If you have not heard from [place] regarding the results in 2 weeks, please contact this office.       Health Maintenance, Female Adopting a healthy lifestyle and getting preventive care are important in promoting health and wellness. Ask your health care provider about:  The right schedule for you to have regular tests and exams.  Things you can do on your own to prevent diseases and keep yourself healthy. What should I know about diet, weight, and exercise? Eat a healthy diet   Eat a diet that includes plenty of vegetables, fruits, low-fat dairy products, and lean protein.  Do not eat a lot of foods that are high in solid fats, added sugars, or sodium. Maintain a healthy weight Body mass index (BMI) is used to identify weight problems. It estimates body fat based on height and  weight. Your health care provider can help determine your BMI and help you achieve or maintain a healthy weight. Get regular exercise Get regular exercise. This is one of the most important things you can do for your health. Most adults should:  Exercise for at least 150 minutes each week. The exercise should increase your heart rate and make you sweat (moderate-intensity exercise).  Do strengthening exercises at least twice a week. This is in addition to the moderate-intensity exercise.  Spend less time sitting. Even light physical activity can be beneficial. Watch cholesterol and blood lipids Have your blood tested for lipids and cholesterol at 46 years of age, then have this test every 5 years. Have your cholesterol levels checked more often if:  Your lipid or cholesterol levels are high.  You are older than 46 years of age.  You are at high risk for heart disease. What should I know about cancer screening? Depending on your health history and family history, you may need to have cancer screening at various ages. This may include screening for:  Breast cancer.  Cervical cancer.  Colorectal cancer.  Skin cancer.  Lung cancer. What should I know about heart disease, diabetes, and high blood pressure? Blood pressure and heart disease  High blood pressure causes heart disease and increases the risk of stroke. This is more likely to develop in people who have high blood pressure readings, are of African descent, or are overweight.  Have your blood pressure checked: ? Every 3-5 years if you are 22-6 years of age. ? Every year if you are 7 years old or older. Diabetes Have regular diabetes screenings. This checks your fasting blood sugar level. Have  the screening done:  Once every three years after age 30 if you are at a normal weight and have a low risk for diabetes.  More often and at a younger age if you are overweight or have a high risk for diabetes. What should I know  about preventing infection? Hepatitis B If you have a higher risk for hepatitis B, you should be screened for this virus. Talk with your health care provider to find out if you are at risk for hepatitis B infection. Hepatitis C Testing is recommended for:  Everyone born from [birth year] through 1967.  Anyone with known risk factors for hepatitis C. Sexually transmitted infections (STIs)  Get screened for STIs, including gonorrhea and chlamydia, if: ? You are sexually active and are younger than 46 years of age. ? You are older than 46 years of age and your health care provider tells you that you are at risk for this type of infection. ? Your sexual activity has changed since you were last screened, and you are at increased risk for chlamydia or gonorrhea. Ask your health care provider if you are at risk.  Ask your health care provider about whether you are at high risk for HIV. Your health care provider may recommend a prescription medicine to help prevent HIV infection. If you choose to take medicine to prevent HIV, you should first get tested for HIV. You should then be tested every 3 months for as long as you are taking the medicine. Pregnancy  If you are about to stop having your period (premenopausal) and you may become pregnant, seek counseling before you get pregnant.  Take 400 to 800 micrograms (mcg) of folic acid every day if you become pregnant.  Ask for birth control (contraception) if you want to prevent pregnancy. Osteoporosis and menopause Osteoporosis is a disease in which the bones lose minerals and strength with aging. This can result in bone fractures. If you are 61 years old or older, or if you are at risk for osteoporosis and fractures, ask your health care provider if you should:  Be screened for bone loss.  Take a calcium or vitamin D supplement to lower your risk of fractures.  Be given hormone replacement therapy (HRT) to treat symptoms of menopause. Follow these  instructions at home: Lifestyle  Do not use any products that contain nicotine or tobacco, such as cigarettes, e-cigarettes, and chewing tobacco. If you need help quitting, ask your health care provider.  Do not use street drugs.  Do not share needles.  Ask your health care provider for help if you need support or information about quitting drugs. Alcohol use  Do not drink alcohol if: ? Your health care provider tells you not to drink. ? You are pregnant, may be pregnant, or are planning to become pregnant.  If you drink alcohol: ? Limit how much you use to 0-1 drink a day. ? Limit intake if you are breastfeeding.  Be aware of how much alcohol is in your drink. In the [place] one drink equals one 12 oz bottle of beer (355 mL), one 5 oz glass of wine (148 mL), or one 1 oz glass of hard liquor (44 mL). General instructions  Schedule regular health, dental, and eye exams.  Stay current with your vaccines.  Tell your health care provider if: ? You often feel depressed. ? You have ever been abused or do not feel safe at home. Summary  Adopting a healthy lifestyle and getting preventive care are  important in promoting health and wellness.  Follow your health care provider's instructions about healthy diet, exercising, and getting tested or screened for diseases.  Follow your health care provider's instructions on monitoring your cholesterol and blood pressure. This information is not intended to replace advice given to you by your health care provider. Make sure you discuss any questions you have with your health care provider. Document Revised: 12/07/2018 Document Reviewed: 12/07/2018 Elsevier Patient Education  2020 Elsevier Inc.      Agustina Caroli, MD [facility]

## 2020-01-22 NOTE — Patient Instructions (Addendum)
   If you have lab work done today you will be contacted with your lab results within the next 2 weeks.  If you have not heard from us then please contact us. The fastest way to get your results is to register for My Chart.   IF you received an x-ray today, you will receive an invoice from [facility]. Please contact [facility] at [phone] with questions or concerns regarding your invoice.   IF you received labwork today, you will receive an invoice from [facility]. Please contact [facility] at [phone] with questions or concerns regarding your invoice.   Our billing staff will not be able to assist you with questions regarding bills from these companies.  You will be contacted with the lab results as soon as they are available. The fastest way to get your results is to activate your My Chart account. Instructions are located on the last page of this paperwork. If you have not heard from us regarding the results in 2 weeks, please contact this office.       Health Maintenance, Female Adopting a healthy lifestyle and getting preventive care are important in promoting health and wellness. Ask your health care provider about:  The right schedule for you to have regular tests and exams.  Things you can do on your own to prevent diseases and keep yourself healthy. What should I know about diet, weight, and exercise? Eat a healthy diet   Eat a diet that includes plenty of vegetables, fruits, low-fat dairy products, and lean protein.  Do not eat a lot of foods that are high in solid fats, added sugars, or sodium. Maintain a healthy weight Body mass index (BMI) is used to identify weight problems. It estimates body fat based on height and weight. Your health care provider can help determine your BMI and help you achieve or maintain a healthy weight. Get regular exercise Get regular exercise. This is one of the most important things you can do for your health. Most  adults should:  Exercise for at least 150 minutes each week. The exercise should increase your heart rate and make you sweat (moderate-intensity exercise).  Do strengthening exercises at least twice a week. This is in addition to the moderate-intensity exercise.  Spend less time sitting. Even light physical activity can be beneficial. Watch cholesterol and blood lipids Have your blood tested for lipids and cholesterol at 46 years of age, then have this test every 5 years. Have your cholesterol levels checked more often if:  Your lipid or cholesterol levels are high.  You are older than 46 years of age.  You are at high risk for heart disease. What should I know about cancer screening? Depending on your health history and family history, you may need to have cancer screening at various ages. This may include screening for:  Breast cancer.  Cervical cancer.  Colorectal cancer.  Skin cancer.  Lung cancer. What should I know about heart disease, diabetes, and high blood pressure? Blood pressure and heart disease  High blood pressure causes heart disease and increases the risk of stroke. This is more likely to develop in people who have high blood pressure readings, are of African descent, or are overweight.  Have your blood pressure checked: ? Every 3-5 years if you are 18-39 years of age. ? Every year if you are 40 years old or older. Diabetes Have regular diabetes screenings. This checks your fasting blood sugar level. Have the screening done:  Once   every three years after age 40 if you are at a normal weight and have a low risk for diabetes.  More often and at a younger age if you are overweight or have a high risk for diabetes. What should I know about preventing infection? Hepatitis B If you have a higher risk for hepatitis B, you should be screened for this virus. Talk with your health care provider to find out if you are at risk for hepatitis B infection. Hepatitis  C Testing is recommended for:  Everyone born from 1945 through 1965.  Anyone with known risk factors for hepatitis C. Sexually transmitted infections (STIs)  Get screened for STIs, including gonorrhea and chlamydia, if: ? You are sexually active and are younger than 46 years of age. ? You are older than 46 years of age and your health care provider tells you that you are at risk for this type of infection. ? Your sexual activity has changed since you were last screened, and you are at increased risk for chlamydia or gonorrhea. Ask your health care provider if you are at risk.  Ask your health care provider about whether you are at high risk for HIV. Your health care provider may recommend a prescription medicine to help prevent HIV infection. If you choose to take medicine to prevent HIV, you should first get tested for HIV. You should then be tested every 3 months for as long as you are taking the medicine. Pregnancy  If you are about to stop having your period (premenopausal) and you may become pregnant, seek counseling before you get pregnant.  Take 400 to 800 micrograms (mcg) of folic acid every day if you become pregnant.  Ask for birth control (contraception) if you want to prevent pregnancy. Osteoporosis and menopause Osteoporosis is a disease in which the bones lose minerals and strength with aging. This can result in bone fractures. If you are 65 years old or older, or if you are at risk for osteoporosis and fractures, ask your health care provider if you should:  Be screened for bone loss.  Take a calcium or vitamin D supplement to lower your risk of fractures.  Be given hormone replacement therapy (HRT) to treat symptoms of menopause. Follow these instructions at home: Lifestyle  Do not use any products that contain nicotine or tobacco, such as cigarettes, e-cigarettes, and chewing tobacco. If you need help quitting, ask your health care provider.  Do not use street  drugs.  Do not share needles.  Ask your health care provider for help if you need support or information about quitting drugs. Alcohol use  Do not drink alcohol if: ? Your health care provider tells you not to drink. ? You are pregnant, may be pregnant, or are planning to become pregnant.  If you drink alcohol: ? Limit how much you use to 0-1 drink a day. ? Limit intake if you are breastfeeding.  Be aware of how much alcohol is in your drink. In the [place] one drink equals one 12 oz bottle of beer (355 mL), one 5 oz glass of wine (148 mL), or one 1 oz glass of hard liquor (44 mL). General instructions  Schedule regular health, dental, and eye exams.  Stay current with your vaccines.  Tell your health care provider if: ? You often feel depressed. ? You have ever been abused or do not feel safe at home. Summary  Adopting a healthy lifestyle and getting preventive care are important in promoting health and   wellness.  Follow your health care provider's instructions about healthy diet, exercising, and getting tested or screened for diseases.  Follow your health care provider's instructions on monitoring your cholesterol and blood pressure. This information is not intended to replace advice given to you by your health care provider. Make sure you discuss any questions you have with your health care provider. Document Revised: 12/07/2018 Document Reviewed: 12/07/2018 Elsevier Patient Education  2020 [place] Maintenance, Female Adoptar un estilo de vida saludable y recibir atencin preventiva son importantes para promover la salud y Musician. Consulte al mdico sobre:  El esquema adecuado para hacerse pruebas y exmenes peridicos.  Cosas que puede hacer por su cuenta para prevenir enfermedades y [employer]. Qu debo saber sobre la dieta, el peso y el ejercicio? Consuma una dieta saludable   Consuma una dieta que  incluya muchas verduras, frutas, productos lcteos con bajo contenido de [place] y Advertising account planner.  No consuma muchos alimentos ricos en grasas slidas, azcares agregados o sodio. Mantenga un peso saludable El ndice de masa muscular [facility]) se [place] the [place] para identificar problemas de [place]. Proporciona una estimacin de la grasa corporal basndose en el peso y la altura. Su mdico puede ayudarle a Radiation protection practitioner [place] y a Scientist, forensic o Theatre manager un peso saludable. Haga ejercicio con regularidad Haga ejercicio con regularidad. Esta es una de las prcticas ms importantes que puede hacer por su salud. La mayora de los adultos deben seguir estas pautas:  Optometrist, al menos, 124minutos de actividad fsica por semana. El ejercicio debe aumentar la frecuencia cardaca y Nature conservation officer transpirar (ejercicio de intensidad moderada).  Hacer ejercicios de fortalecimiento por lo [employer] por semana. Agregue esto a su plan de ejercicio de intensidad moderada.  Pasar menos tiempo sentados. Incluso la actividad fsica ligera puede ser beneficiosa. Controle sus niveles de colesterol y lpidos en la sangre Comience a realizarse anlisis de lpidos y Research officer, trade union en la sangre a los 20aos y luego reptalos cada 5aos. Hgase controlar los niveles de colesterol con mayor frecuencia si:  Sus niveles de lpidos y colesterol son altos.  Es mayor de 40aos.  Presenta un alto riesgo de padecer enfermedades cardacas. Qu debo saber sobre las pruebas de deteccin del cncer? Segn su historia clnica y sus antecedentes familiares, es posible que deba realizarse pruebas de deteccin del cncer en diferentes edades. Esto puede incluir pruebas de deteccin de lo siguiente:  Cncer de mama.  Cncer de cuello uterino.  Cncer colorrectal.  Cncer de piel.  Cncer de pulmn. Qu debo saber sobre la enfermedad cardaca, la diabetes y la hipertensin arterial? Presin arterial y enfermedad cardaca  La hipertensin  arterial causa enfermedades cardacas y [place] el riesgo de accidente cerebrovascular. Es ms probable que esto se manifieste en las personas que tienen lecturas de presin arterial alta, tienen ascendencia africana o tienen sobrepeso.  Hgase controlar la presin arterial: ? Cada 3 a 5 aos si tiene entre 18 y 38 aos. ? Todos los aos si es mayor de [place]. Diabetes Realcese exmenes de deteccin de la diabetes con regularidad. Este anlisis revisa el nivel de azcar en la sangre en [place]. Hgase las pruebas de deteccin:  Cada tresaos despus de los 29aos de edad si tiene un peso normal y un bajo riesgo de padecer diabetes.  Con ms frecuencia y a partir de [place] edad inferior si tiene sobrepeso o un alto riesgo de padecer diabetes. Qu debo saber sobre la prevencin  de infecciones? Hepatitis B Si tiene un riesgo ms alto de contraer hepatitis B, debe someterse a un examen de deteccin de este virus. Hable con el mdico para averiguar si tiene riesgo de contraer la infeccin por hepatitis B. Hepatitis C Se recomienda el anlisis a:  [employer] 1945 y 1965.  Todas las personas que tengan un riesgo de haber contrado hepatitis C. Enfermedades de transmisin sexual (ETS)  Hgase las pruebas de Programme researcher, broadcasting/film/video de ITS, incluidas la gonorrea y la clamidia, si: ? Es sexualmente activa y es menor de [place]. ? Es mayor de 24aos, y Investment banker, operational informa que corre riesgo de tener este tipo de infecciones. ? La actividad sexual ha cambiado desde que le hicieron la ltima prueba de deteccin y tiene un riesgo mayor de Best boy clamidia o Radio broadcast assistant. Pregntele al mdico si usted tiene riesgo.  Pregntele al mdico si usted tiene un alto riesgo de Museum/gallery curator VIH. El mdico tambin puede recomendarle un medicamento recetado para ayudar a evitar la infeccin por el VIH. Si elige tomar medicamentos para prevenir el VIH, primero debe [employer] de deteccin del VIH. Luego debe  hacerse anlisis cada 25meses mientras est tomando los medicamentos. Embarazo  Si est por dejar de Librarian, academic (fase premenopusica) y usted puede quedar G. L. Garci­a, busque asesoramiento antes de [place].  Tome de 400 a [id number] (mcg) de cido [employer] si [place].  Pida mtodos de control de la natalidad (anticonceptivos) si desea evitar un embarazo no deseado. Osteoporosis y [place] La osteoporosis es una enfermedad en la que los huesos pierden los minerales y la fuerza por el avance de la edad. El resultado pueden ser fracturas en los [place]. Si tiene 65aos o ms, o si est en riesgo de sufrir osteoporosis y fracturas, pregunte a su mdico si debe:  Hacerse pruebas de deteccin de prdida sea.  Tomar un suplemento de calcio o de vitamina D para reducir el riesgo de fracturas.  Recibir terapia de reemplazo hormonal (TRH) para tratar los sntomas de la menopausia. Siga estas instrucciones en su casa: Estilo de vida  No consuma ningn producto que contenga nicotina o tabaco, como cigarrillos, cigarrillos electrnicos y tabaco de Higher education careers adviser. Si necesita ayuda para dejar de fumar, consulte al mdico.  No consuma drogas.  No comparta agujas.  Solicite ayuda a su mdico si necesita apoyo o informacin para abandonar las drogas. Consumo de alcohol  No beba alcohol si: ? Su mdico le indica no hacerlo. ? Est embarazada, puede estar embarazada o est tratando de quedar embarazada.  Si bebe alcohol: ? Limite la cantidad que consume de 0 a 1 medida por da. ? Limite la ingesta si est amamantando.  Est atento a la cantidad de alcohol que hay en las bebidas que toma. En los [place], una medida equivale a una botella de cerveza de 12oz (385ml), un vaso de vino de 5oz (16ml) o un vaso de una bebida alcohlica de alta graduacin de 1oz (14ml). Instrucciones generales  Realcese los estudios de rutina de la salud, dentales y de [place].  [place].  Infrmele a su mdico si: ? Se siente deprimida con frecuencia. ? Alguna vez ha sido vctima de [place] o no se siente segura en su casa. Resumen  Adoptar un estilo de vida saludable y recibir atencin preventiva son importantes para promover la salud y Musician.  Siga las instrucciones del mdico acerca de una dieta saludable, el ejercicio  y la realizacin de pruebas o exmenes para Engineer, building services.  Siga las instrucciones del mdico con respecto al control del colesterol y la presin arterial. Esta informacin no tiene Marine scientist el consejo del mdico. Asegrese de hacerle al mdico cualquier pregunta que tenga. Document Revised: 01/04/2019 Document Reviewed: 01/04/2019 Elsevier Patient Education  [place].

## 2020-01-23 ENCOUNTER — Telehealth: Payer: Self-pay | Admitting: Emergency Medicine

## 2020-01-23 ENCOUNTER — Other Ambulatory Visit: Payer: Self-pay | Admitting: Emergency Medicine

## 2020-01-23 DIAGNOSIS — E785 Hyperlipidemia, unspecified: Secondary | ICD-10-CM

## 2020-01-23 DIAGNOSIS — R7303 Prediabetes: Secondary | ICD-10-CM

## 2020-01-23 LAB — CBC WITH DIFFERENTIAL/PLATELET
Basophils Absolute: 0 10*3/uL (ref 0.0–0.2)
Basos: 0 %
EOS (ABSOLUTE): 0.1 10*3/uL (ref 0.0–0.4)
Eos: 2 %
Hematocrit: 35.1 % (ref 34.0–46.6)
Hemoglobin: 12 g/dL (ref 11.1–15.9)
Immature Grans (Abs): 0.1 10*3/uL (ref 0.0–0.1)
Immature Granulocytes: 1 %
Lymphocytes Absolute: 2.4 10*3/uL (ref 0.7–3.1)
Lymphs: 31 %
MCH: 29.5 pg (ref 26.6–33.0)
MCHC: 34.2 g/dL (ref 31.5–35.7)
MCV: 86 fL (ref 79–97)
Monocytes Absolute: 0.6 10*3/uL (ref 0.1–0.9)
Monocytes: 8 %
Neutrophils Absolute: 4.4 10*3/uL (ref 1.4–7.0)
Neutrophils: 58 %
Platelets: 500 10*3/uL — ABNORMAL HIGH (ref 150–450)
RBC: 4.07 x10E6/uL (ref 3.77–5.28)
RDW: 14.3 % (ref 11.7–15.4)
WBC: 7.6 10*3/uL (ref 3.4–10.8)

## 2020-01-23 LAB — CMP14+EGFR
ALT: 29 IU/L (ref 0–32)
AST: 18 IU/L (ref 0–40)
Albumin/Globulin Ratio: 1.7 (ref 1.2–2.2)
Albumin: 4.5 g/dL (ref 3.8–4.8)
Alkaline Phosphatase: 91 IU/L (ref 39–117)
BUN/Creatinine Ratio: 14 (ref 9–23)
BUN: 10 mg/dL (ref 6–24)
Bilirubin Total: <0.2 mg/dL (ref 0.0–1.2)
CO2: 21 mmol/L (ref 20–29)
Calcium: 9.6 mg/dL (ref 8.7–10.2)
Chloride: 104 mmol/L (ref 96–106)
Creatinine, Ser: 0.74 mg/dL (ref 0.57–1.00)
GFR calc Af Amer: 113 mL/min/{1.73_m2} (ref >59)
GFR calc non Af Amer: 98 mL/min/{1.73_m2} (ref >59)
Globulin, Total: 2.7 g/dL (ref 1.5–4.5)
Glucose: 87 mg/dL (ref 65–99)
Potassium: 3.9 mmol/L (ref 3.5–5.2)
Sodium: 137 mmol/L (ref 134–144)
Total Protein: 7.2 g/dL (ref 6.0–8.5)

## 2020-01-23 LAB — HEMOGLOBIN A1C
Est. average glucose Bld gHb Est-mCnc: 137 mg/dL
Hgb A1c MFr Bld: 6.4 % — ABNORMAL HIGH (ref 4.8–5.6)

## 2020-01-23 LAB — LIPID PANEL
Chol/HDL Ratio: 5 ratio — ABNORMAL HIGH (ref 0.0–4.4)
Cholesterol, Total: 286 mg/dL — ABNORMAL HIGH (ref 100–199)
HDL: 57 mg/dL (ref >39)
LDL Chol Calc (NIH): 183 mg/dL — ABNORMAL HIGH (ref 0–99)
Triglycerides: 242 mg/dL — ABNORMAL HIGH (ref 0–149)
VLDL Cholesterol Cal: 46 mg/dL — ABNORMAL HIGH (ref 5–40)

## 2020-01-23 MED ORDER — METFORMIN HCL 500 MG PO TABS: 500.0000 mg | ORAL_TABLET | Freq: Two times a day (BID) | ORAL | 3 refills | Status: DC

## 2020-01-23 MED ORDER — ROSUVASTATIN CALCIUM 10 MG PO TABS: 10.0000 mg | ORAL_TABLET | Freq: Every day | ORAL | 3 refills | Status: DC

## 2020-01-23 NOTE — Telephone Encounter (Signed)
Blood results discussed with patient.  Prediabetes and dyslipidemia.  Started on Metformin and Crestor.

## 2020-01-25 LAB — CYTOLOGY - PAP
Adequacy: ABSENT
Diagnosis: NEGATIVE

## 2020-01-30 ENCOUNTER — Encounter: Payer: Self-pay | Admitting: Radiology

## 2020-03-11 ENCOUNTER — Ambulatory Visit (INDEPENDENT_AMBULATORY_CARE_PROVIDER_SITE_OTHER): Payer: Self-pay | Admitting: Emergency Medicine

## 2020-03-11 ENCOUNTER — Encounter: Payer: Self-pay | Admitting: Emergency Medicine

## 2020-03-11 ENCOUNTER — Other Ambulatory Visit: Payer: Self-pay

## 2020-03-11 ENCOUNTER — Other Ambulatory Visit: Payer: Self-pay | Admitting: Emergency Medicine

## 2020-03-11 VITALS — BP 118/76 | HR 76 | Temp 97.7°F | Ht 59.5 in | Wt 169.0 lb

## 2020-03-11 DIAGNOSIS — K6289 Other specified diseases of anus and rectum: Secondary | ICD-10-CM

## 2020-03-11 MED ORDER — HYDROCORTISONE ACETATE 25 MG RE SUPP: 25.0000 mg | Freq: Two times a day (BID) | RECTAL | 1 refills | Status: DC

## 2020-03-11 NOTE — Telephone Encounter (Signed)
Requested medication (s) are due for refill today: yes  Requested medication (s) are on the active medication list: yes  Last refill:  01/14/212  Future visit scheduled: no  Notes to clinic:  off protocol failed  Requested Prescriptions  Pending Prescriptions Disp Refills   hydrocortisone (ANUSOL-HC) 2.5 % rectal cream [Pharmacy Med Name: HYDROCORTISONE 2.5% CREAM RECT] 30 g 0    Sig: APPLY TO RECTUM TWICE DAILY.      Off-Protocol Failed - 03/11/2020  6:08 PM      Failed - Medication not assigned to a protocol, review manually.      Passed - Valid encounter within last 12 months    Recent Outpatient Visits           Today Rectal pain   [facility], Ines Bloomer, MD   1 month ago Routine general medical examination at a health care facility   [facility], Ines Bloomer, MD   2 months ago Anal fissure   [facility] at [facility], Ines Bloomer, MD   2 years ago Pelvic pain   [facility] at [facility], Ines Bloomer, MD   3 years ago Annual physical exam   [facility] at [place], [place], [place]             Over the Counter:  OTC Passed - 03/11/2020  6:08 PM      Passed - Valid encounter within last 12 months    Recent Outpatient Visits           Today Rectal pain   [facility] at [facility], Ines Bloomer, MD   1 month ago Routine general medical examination at a health care facility   [facility], Ines Bloomer, MD   2 months ago Anal fissure   [place] [facility], Ines Bloomer, MD   2 years ago Pelvic pain   [facility] at [facility], Ines Bloomer, MD   3 years ago Annual physical exam   [facility] at [place], [place], [place]

## 2020-03-11 NOTE — Patient Instructions (Addendum)
   If you have lab work done today you will be contacted with your lab results within the next 2 weeks.  If you have not heard from us then please contact us. The fastest way to get your results is to register for My Chart.   IF you received an x-ray today, you will receive an invoice from [facility]. Please contact [facility] at [phone] with questions or concerns regarding your invoice.   IF you received labwork today, you will receive an invoice from [facility]. Please contact [facility] at [phone] with questions or concerns regarding your invoice.   Our billing staff will not be able to assist you with questions regarding bills from these companies.  You will be contacted with the lab results as soon as they are available. The fastest way to get your results is to activate your My Chart account. Instructions are located on the last page of this paperwork. If you have not heard from us regarding the results in 2 weeks, please contact this office.     Hemorroides Hemorrhoids Las hemorroides son venas inflamadas que pueden desarrollarse:  En el ano (recto). Estas se denominan hemorroides internas.  Alrededor de la abertura del ano. Estas se denominan hemorroides externas. Las hemorroides pueden causar dolor, picazn o hemorragias. Generalmente no causan problemas graves. Con frecuencia mejoran al cambiar la dieta, el estilo de vida y otros tratamientos en el hogar. Cules son las causas? Esta afeccin puede ser causada por lo siguiente:  Tener dificultad para defecar (estreimiento).  Hacer mucha fuerza (esfuerzo) para defecar.  Materia fecal lquida (diarrea).  Embarazo.  Tener mucho sobrepeso (obesidad).  Estar sentado durante largos perodos de tiempo.  Levantar objetos pesados u otras actividades que impliquen esfuerzo.  Sexo anal.  Andar en bicicleta por un largo perodo de tiempo. Cules son los signos o los sntomas? Los sntomas de  esta afeccin incluyen los siguientes:  Dolor.  Picazn o irritacin en el ano.  Sangrado proveniente del ano.  Prdida de materia fecal.  Inflamacin en la zona.  Uno o ms bultos alrededor de la abertura del ano. Cmo se diagnostica? A menudo un mdico puede diagnosticar esta afeccin al observar la zona afectada. El mdico tambin puede:  Realizar un examen que implica palpar la zona con la mano enguantada (examen rectal digital).  Examinar el interior de la zona anal utilizando un pequeo tubo (anoscopio).  Pedir anlisis de sangre. Es posible que esto se realice si ha perdido mucha sangre.  Solicitarle un estudio que consiste en la observacin del interior del colon utilizando un tubo flexible con una cmara en el extremo (sigmoidoscopia o colonoscopa). Cmo se trata? Esta afeccin generalmente se puede tratar en el hogar. El mdico puede indicarle que cambie de alimentacin, de estilo de vida o que trate de realizar tratamientos en el hogar. Si esto no da resultado, se pueden realizar procedimientos para extirpar las hemorroides o reducir su tamao. Estos pueden implicar lo siguiente:  Colocar bandas elsticas en la base de las hemorroides para interrumpir la irrigacin de la sangre.  Inyectar un medicamento en las hemorroides para reducir su tamao.  Dirigir un tipo de energa de luz hacia las hemorroides para hacer que se caigan.  Realizar una ciruga para extirpar las hemorroides o cortar la irrigacin de sangre. Siga estas indicaciones en su casa: Comida y bebida   Consuma alimentos con alto contenido de fibra. Entre ellos cereales integrales, frijoles, frutos secos, frutas y verduras.  Pregntele a su mdico   acerca de tomar productos con fibra aadida en ellos (complementos defibra).  Disminuya la cantidad de grasa de la dieta. Para esto, puede hacer lo siguiente: ? Coma productos lcteos descremados. ? Coma menos carne roja. ? No consuma alimentos  procesados.  Beba suficiente lquido para mantener la orina de color amarillo plido. Control del dolor y la hinchazn   Tome un bao de agua tibia (bao de asiento) durante 20 minutos para aliviar el dolor. Hgalo 3 o 4veces al da. Puede hacer esto en una baera o usar un dispositivo porttil para bao de asiento que se coloca sobre el inodoro.  Si se lo indican, aplique hielo sobre la zona dolorida. Puede ser beneficioso aplicar hielo entre los baos con agua tibia. ? Ponga el hielo en una bolsa plstica. ? Coloque una toalla entre la piel y la bolsa. ? Coloque el hielo durante 20minutos, 2 a 3veces por da. Indicaciones generales  Tome los medicamentos de venta libre y los recetados solamente como se lo haya indicado el mdico. ? Las cremas medicinales y los medicamentos pueden usarse como se lo hayan indicado.  Haga ejercicio fsico con frecuencia. Consulte al mdico qu tipos de ejercicios son mejores para usted y qu cantidad.  Vaya al bao cuando sienta ganas de defecar. No espere.  Evite hacer demasiada fuerza al defecar.  Mantenga el ano seco y limpio. Use papel higinico hmedo o toallitas humedecidas despus de defecar.  No pase mucho tiempo sentado en el inodoro.  Concurra a todas las visitas de seguimiento como se lo haya indicado el mdico. Esto es importante. Comunquese con un mdico si:  Tiene dolor e hinchazn que no mejoran con el tratamiento o los medicamentos.  Tiene problemas para defecar.  No puede defecar.  Tiene dolor o hinchazn en la zona exterior de las hemorroides. Solicite ayuda inmediatamente si tiene:  Hemorragia que no se detiene. Resumen  Las hemorroides son venas hinchadas en el ano o la zona que rodea el ano.  Pueden causar dolor, picazn o sangrado.  Consuma alimentos con alto contenido de fibra. Entre ellos cereales integrales, frijoles, frutos secos, frutas y verduras.  Tome un bao de agua tibia (bao de asiento) durante 20  minutos para aliviar el dolor. Hgalo 3 o 4veces al da. Esta informacin no tiene como fin reemplazar el consejo del mdico. Asegrese de hacerle al mdico cualquier pregunta que tenga. Document Revised: 06/23/2018 Document Reviewed: 06/23/2018 Elsevier Patient Education  2020 Elsevier Inc.  

## 2020-03-11 NOTE — Progress Notes (Signed)
Arbutus Ped Savo 44 y.o.   Chief Complaint  Patient presents with  . Anal Fissure    rectum / follow up . reports bleeding w/ bms, pain & itching    HISTORY OF PRESENT ILLNESS: This is a 44 y.o. female complaining of rectal pain and itching for several days.  Has a history of anal fissure.  Denies rectal bleeding.  HPI   Prior to Admission medications   Medication Sig Start Date End Date Taking? Authorizing Provider  hydrocortisone (ANUSOL-HC) 2.5 % rectal cream Place 1 application rectally 2 (two) times daily. 01/11/20  Yes Fernado Brigante, Ines Bloomer, MD  metFORMIN (GLUCOPHAGE) 500 MG tablet Take 1 tablet (500 mg total) by mouth 2 (two) times daily with a meal. 01/23/20  Yes Taijuan Serviss, Ines Bloomer, MD  rosuvastatin (CRESTOR) 10 MG tablet Take 1 tablet (10 mg total) by mouth daily. 01/23/20  Yes Hilberto Burzynski, Ines Bloomer, MD  hydrocortisone (ANUSOL-HC) 25 MG suppository Place 1 suppository (25 mg total) rectally 2 (two) times daily. 03/11/20   Horald Pollen, MD  OVER THE COUNTER MEDICATION     [provider]  OVER THE COUNTER MEDICATION     [provider]    No Known Allergies  Patient Active Problem List   Diagnosis Date Noted  . History of ovarian cyst 03/09/2018    No past medical history on file.  Past Surgical History:  Procedure Laterality Date  . CESAREAN SECTION     11 and 22 years ago    Social History   Socioeconomic History  . Marital status: Married    Spouse name: Not on file  . Number of children: Not on file  . Years of education: Not on file  . Highest education level: Not on file  Occupational History  . Not on file  Tobacco Use  . Smoking status: Never Smoker  . Smokeless tobacco: Never Used  Substance and Sexual Activity  . Alcohol use: Not on file  . Drug use: Not on file  . Sexual activity: Not on file  Other Topics Concern  . Not on file  Social History Narrative  . Not on file   Social Determinants of Health    Financial Resource Strain:   . Difficulty of Paying Living Expenses:   Food Insecurity:   . Worried About Charity fundraiser in the Last Year:   . Arboriculturist in the Last Year:   Transportation Needs:   . Film/video editor (Medical):   Marland Kitchen Lack of Transportation (Non-Medical):   Physical Activity:   . Days of Exercise per Week:   . Minutes of Exercise per Session:   Stress:   . Feeling of Stress :   Social Connections:   . Frequency of Communication with Friends and Family:   . Frequency of Social Gatherings with Friends and Family:   . Attends Religious Services:   . Active Member of Clubs or Organizations:   . Attends Archivist Meetings:   Marland Kitchen Marital Status:   Intimate Partner Violence:   . Fear of Current or Ex-Partner:   . Emotionally Abused:   Marland Kitchen Physically Abused:   . Sexually Abused:     Family History  Problem Relation Age of Onset  . Hypertension Father   . Diabetes Paternal Aunt      Review of Systems  Constitutional: Negative.  Negative for chills and fever.  HENT: Negative.  Negative for congestion and sore throat.   Respiratory: Negative.  Negative for cough and shortness of breath.   Cardiovascular: Negative.  Negative for chest pain and palpitations.  Gastrointestinal: Negative.  Negative for abdominal pain, diarrhea, nausea and vomiting.  Genitourinary: Negative.  Negative for dysuria and hematuria.  Musculoskeletal: Negative.   Skin: Negative.  Negative for rash.  Neurological: Negative.  Negative for dizziness and headaches.  All other systems reviewed and are negative.  Vitals:   03/11/20 1632  BP: 118/76  Pulse: 76  Temp: 97.7 F (36.5 C)  SpO2: 97%     Physical Exam Vitals reviewed.  Constitutional:      Appearance: Normal appearance.  HENT:     Head: Normocephalic.  Eyes:     Extraocular Movements: Extraocular movements intact.     Pupils: Pupils are equal, round, and reactive to light.  Cardiovascular:      Rate and Rhythm: Normal rate.  Pulmonary:     Effort: Pulmonary effort is normal.  Abdominal:     Palpations: Abdomen is soft.     Tenderness: There is no abdominal tenderness.  Genitourinary:    Rectum: Anal fissure and internal hemorrhoid present.  Musculoskeletal:     Cervical back: Normal range of motion.  Skin:    General: Skin is warm and dry.     Capillary Refill: Capillary refill takes less than 2 seconds.  Neurological:     General: No focal deficit present.     Mental Status: She is alert and oriented to person, place, and time.  Psychiatric:        Mood and Affect: Mood normal.        Behavior: Behavior normal.      ASSESSMENT & PLAN: Naydeline was seen today for anal fissure.  Diagnoses and all orders for this visit:  Rectal pain Comments: Suspected internal hemorrhoids Orders: -     hydrocortisone (ANUSOL-HC) 25 MG suppository; Place 1 suppository (25 mg total) rectally 2 (two) times daily. -     Ambulatory referral to Gastroenterology    Patient Instructions       If you have lab work done today you will be contacted with your lab results within the next 2 weeks.  If you have not heard from [place] then please contact us. The fastest way to get your results is to register for My Chart.   IF you received an x-ray today, you will receive an invoice from [facility]. Please contact [facility] at [phone] with questions or concerns regarding your invoice.   IF you received labwork today, you will receive an invoice from [place]. Please contact [facility] at [phone] with questions or concerns regarding your invoice.   Our billing staff will not be able to assist you with questions regarding bills from these companies.  You will be contacted with the lab results as soon as they are available. The fastest way to get your results is to activate your My Chart account. Instructions are located on the last page of this paperwork. If you have  not heard from [place] regarding the results in 2 weeks, please contact this office.      Hemorroides Hemorrhoids Las hemorroides son venas inflamadas que pueden desarrollarse:  En el ano (recto). Estas se denominan hemorroides internas.  Alrededor de la abertura del ano. Estas se denominan hemorroides externas. Las hemorroides pueden causar dolor, picazn o hemorragias. Generalmente no causan problemas graves. Con frecuencia mejoran al [employer] dieta, el estilo de vida y otros tratamientos Financial planner. Cules son las causas? Esta  afeccin puede ser causada por lo siguiente:  Tener dificultad para defecar (estreimiento).  Hacer mucha fuerza (esfuerzo) para defecar.  Materia fecal lquida (diarrea).  Embarazo.  Tener mucho sobrepeso (obesidad).  Estar sentado durante largos perodos de [place].  Levantar objetos pesados u otras actividades que impliquen esfuerzo.  Sexo anal.  Andar en bicicleta por un largo perodo de tiempo. Cules son los signos o los sntomas? Los sntomas de esta afeccin incluyen los siguientes:  Social research officer, government.  Picazn o irritacin en el ano.  Sangrado proveniente del ano.  Prdida de materia fecal.  Inflamacin en la zona.  Uno o ms bultos alrededor de la abertura del ano. Cmo se diagnostica? A menudo un mdico puede diagnosticar esta afeccin al observar la zona afectada. El mdico tambin puede:  Optometrist un examen que implica palpar la zona con la mano enguantada (examen rectal digital).  Examinar el interior de la zona anal utilizando un pequeo tubo (anoscopio).  Pedir anlisis de [place]. Es posible que esto se realice si ha perdido Optometrist.  Solicitarle un estudio que consiste en la observacin del interior del colon utilizando un tubo flexible con una cmara en el extremo (sigmoidoscopia o colonoscopa). Cmo se trata? Esta afeccin generalmente se puede tratar en el hogar. El mdico puede indicarle que cambie de Designer, multimedia, de  estilo de vida o que trate de Physicist, medical. Si esto no da resultado, se pueden realizar procedimientos para extirpar las hemorroides o reducir Publishing rights manager. Estos pueden implicar lo siguiente:  Building services engineer en la base de las hemorroides para interrumpir la irrigacin de Herbalist.  Inyectar un medicamento en las hemorroides para reducir Publishing rights manager.  Dirigir un tipo de Teacher, early years/pre de luz hacia las hemorroides para Field seismologist que se caigan.  Realizar Ardelia Mems ciruga para extirpar las hemorroides o cortar la irrigacin de [place]. Siga estas indicaciones en su casa: Comida y bebida   Consuma alimentos con alto contenido de [place]. Entre ellos cereales integrales, frijoles, frutos secos, frutas y verduras.  Pregntele a su mdico acerca de tomar productos con fibra aadida en ellos (complementos defibra).  Disminuya la cantidad de grasa de la dieta. Para esto, puede hacer lo siguiente: ? Coma productos lcteos descremados. ? Coma menos carne roja. ? No consuma alimentos procesados.  Beba suficiente lquido para Consulting civil engineer orina de color amarillo plido. Control del dolor y [place] un bao de agua tibia (bao de asiento) durante 20 minutos para Best boy. Hgalo 3 o 4veces al da. Puede hacer esto en una baera o usar un dispositivo porttil para bao de asiento que se coloca sobre el inodoro.  Si se lo indican, aplique hielo sobre la zona dolorida. Puede ser beneficioso aplicar hielo [employer] baos con agua tibia. ? Ponga el hielo en una bolsa plstica. ? Coloque una [employer] piel y Therapist, nutritional. ? Coloque el hielo durante 25minutos, 2 a 3veces por da. Indicaciones generales  [employer] de venta libre y los recetados solamente como se lo haya indicado el mdico. ? Las [employer] y los medicamentos pueden usarse como se lo hayan indicado.  Haga ejercicio fsico con frecuencia. Consulte al mdico qu tipos de ejercicios son  mejores para usted y qu cantidad.  Vaya al bao cuando sienta ganas de defecar. No espere.  Evite hacer demasiada fuerza al defecar.  Mantenga el ano seco y limpio. Use papel higinico hmedo o toallitas humedecidas despus de defecar.  No pase mucho  tiempo sentado en el inodoro.  Concurra a todas las visitas de seguimiento como se lo haya indicado el mdico. Esto es importante. Comunquese con un mdico si:  Tiene dolor e hinchazn que no mejoran con el tratamiento o los medicamentos.  Tiene problemas para defecar.  No puede defecar.  Tiene dolor o hinchazn en la zona exterior de las hemorroides. Solicite ayuda inmediatamente si tiene:  Hemorragia que no se detiene. Resumen  Las hemorroides son venas hinchadas en el ano o la zona que rodea el ano.  Pueden causar dolor, picazn o sangrado.  Consuma alimentos con alto contenido de [place]. Entre ellos cereales integrales, frijoles, frutos secos, frutas y verduras.  Tome un bao de agua tibia (bao de asiento) durante 20 minutos para Best boy. Hgalo 3 o 4veces al da. Esta informacin no tiene Marine scientist el consejo del mdico. Asegrese de hacerle al mdico cualquier pregunta que tenga. Document Revised: 06/23/2018 Document Reviewed: 06/23/2018 Elsevier Patient Education  2020 Elsevier Inc.      Agustina Caroli, MD [facility]

## 2020-03-13 ENCOUNTER — Encounter: Payer: Self-pay | Admitting: Emergency Medicine

## 2020-03-14 ENCOUNTER — Encounter: Payer: Self-pay | Admitting: Physician Assistant

## 2020-03-22 ENCOUNTER — Encounter: Payer: Self-pay | Admitting: Physician Assistant

## 2020-03-22 ENCOUNTER — Ambulatory Visit: Payer: Self-pay | Admitting: Physician Assistant

## 2020-03-22 VITALS — BP 110/70 | HR 80 | Temp 97.6°F | Ht 59.5 in | Wt 166.2 lb

## 2020-03-22 DIAGNOSIS — Z1212 Encounter for screening for malignant neoplasm of rectum: Secondary | ICD-10-CM

## 2020-03-22 DIAGNOSIS — Z1211 Encounter for screening for malignant neoplasm of colon: Secondary | ICD-10-CM

## 2020-03-22 DIAGNOSIS — Z01818 Encounter for other preprocedural examination: Secondary | ICD-10-CM

## 2020-03-22 DIAGNOSIS — K602 Anal fissure, unspecified: Secondary | ICD-10-CM

## 2020-03-22 NOTE — Patient Instructions (Signed)
You have been scheduled for a colonoscopy. Please follow written instructions given to you at your visit today.  Please pick up your prep supplies at the pharmacy within the next 1-3 days. If you use inhalers (even only as needed), please bring them with you on the day of your procedure. Your physician has requested that you go to www.startemmi.com and enter the access code given to you at your visit today. This web site gives a general overview about your procedure. However, you should still follow specific instructions given to you by our office regarding your preparation for the procedure.  If you are age 55 or older, your body mass index should be between 23-30. Your Body mass index is 33.01 kg/m. If this is out of the aforementioned range listed, please consider follow up with your Primary Care Provider.  If you are age 51 or younger, your body mass index should be between 19-25. Your Body mass index is 33.01 kg/m. If this is out of the aformentioned range listed, please consider follow up with your Primary Care Provider.   Due to recent changes in healthcare laws, you may see the results of your imaging and laboratory studies on MyChart before your provider has had a chance to review them.  We understand that in some cases there may be results that are confusing or concerning to you. Not all laboratory results come back in the same time frame and the provider may be waiting for multiple results in order to interpret others.  Please give [place] 48 hours in order for your provider to thoroughly review all the results before contacting the office for clarification of your results.

## 2020-03-22 NOTE — Progress Notes (Signed)
Chief Complaint: Rectal pain  HPI:    Brianna Vega is a 44 year old Hispanic female with a past medical history as listed below, who presents clinic today with her daughter who acts as her Optometrist, who was referred to me by Horald Pollen, * for a complaint of anal fissure.      03/11/2020 patient seen by PCP for rectal pain.  At that time diagnosed with "multiple anal fissures", given hydrocortisone suppositories.    Today, the patient explains that she started having fissures about 3 months ago and saw her PCP who gave her some cream but this did not help at all, at follow-up as above she was given suppositories and has used these over the past week and the pain is completely gone over the past 2 days.  Prior to this describes a sharp pain which was worse after having a bowel movement and worse when she was walking as it felt like it was "tearing".  Also saw some bright red blood with this.  Does explain that she was constipated initially when all this started but is currently taking MiraLAX once a day and has had no further hard stools and is not straining.    Denies fever, chills or weight loss.  Past Medical History:  Diagnosis Date  . Anal fissure   . Anxiety   . Hypertension   . Pre-diabetes     Past Surgical History:  Procedure Laterality Date  . CESAREAN SECTION     x2;; 11 and 22 years ago    Current Outpatient Medications  Medication Sig Dispense Refill  . hydrocortisone (ANUSOL-HC) 2.5 % rectal cream Place 1 application rectally 2 (two) times daily. 30 g 0  . hydrocortisone (ANUSOL-HC) 25 MG suppository Place 1 suppository (25 mg total) rectally 2 (two) times daily. 12 suppository 1  . metFORMIN (GLUCOPHAGE) 500 MG tablet Take 1 tablet (500 mg total) by mouth 2 (two) times daily with a meal. 180 tablet 3  . OVER THE COUNTER MEDICATION     . rosuvastatin (CRESTOR) 10 MG tablet Take 1 tablet (10 mg total) by mouth daily. 90 tablet 3   No current facility-administered  medications for this visit.    Allergies as of 03/22/2020  . (No Known Allergies)    Family History  Problem Relation Age of Onset  . Hypertension Father   . Diabetes Paternal Aunt   . Colon cancer Neg Hx   . Esophageal cancer Neg Hx   . Pancreatic cancer Neg Hx   . Stomach cancer Neg Hx   . Liver disease Neg Hx     Social History   Socioeconomic History  . Marital status: Married    Spouse name: Not on file  . Number of children: Not on file  . Years of education: Not on file  . Highest education level: Not on file  Occupational History  . Not on file  Tobacco Use  . Smoking status: Never Smoker  . Smokeless tobacco: Never Used  Substance and Sexual Activity  . Alcohol use: Not Currently  . Drug use: Not on file  . Sexual activity: Not on file  Other Topics Concern  . Not on file  Social History Narrative  . Not on file   Social Determinants of Health   Financial Resource Strain:   . Difficulty of Paying Living Expenses:   Food Insecurity:   . Worried About Charity fundraiser in the Last Year:   . [employer] of  Food in the Last Year:   Transportation Needs:   . Film/video editor (Medical):   Marland Kitchen Lack of Transportation (Non-Medical):   Physical Activity:   . Days of Exercise per Week:   . Minutes of Exercise per Session:   Stress:   . Feeling of Stress :   Social Connections:   . Frequency of Communication with Friends and Family:   . Frequency of Social Gatherings with Friends and Family:   . Attends Religious Services:   . Active Member of Clubs or Organizations:   . Attends Archivist Meetings:   Marland Kitchen Marital Status:   Intimate Partner Violence:   . Fear of Current or Ex-Partner:   . Emotionally Abused:   Marland Kitchen Physically Abused:   . Sexually Abused:     Review of Systems:    Constitutional: No weight loss, fever or chills Skin: No rash  Cardiovascular: No chest pain  Respiratory: No SOB  Gastrointestinal: See HPI and otherwise  negative Genitourinary: No dysuria  Neurological: No headache Musculoskeletal: No new muscle or joint pain Hematologic: No bruising Psychiatric: No history of depression or anxiety   Physical Exam:  Vital signs: BP 110/70   Pulse 80   Temp 97.6 F (36.4 C)   Ht 4' 11.5" (1.511 m)   Wt 166 lb 3.2 oz (75.4 kg)   BMI 33.01 kg/m   Constitutional:   Pleasant Hispanic female appears to be in NAD, Well developed, Well nourished, alert and cooperative Head:  Normocephalic and atraumatic. Eyes:   PEERL, EOMI. No icterus. Conjunctiva pink. Ears:  Normal auditory acuity. Neck:  Supple Throat: Oral cavity and pharynx without inflammation, swelling or lesion.  Respiratory: Respirations even and unlabored. Lungs clear to auscultation bilaterally.   No wheezes, crackles, or rhonchi.  Cardiovascular: Normal S1, S2. No MRG. Regular rate and rhythm. No peripheral edema, cyanosis or pallor.  Gastrointestinal:  Soft, nondistended, nontender. No rebound or guarding. Normal bowel sounds. No appreciable masses or hepatomegaly. Rectal:  External: no fissure observed, no ttp; Internal: tight sphincter limits exam, no tenderness or mass appreciated Msk:  Symmetrical without gross deformities. Without edema, no deformity or joint abnormality.  Neurologic:  Alert and  oriented x4;  grossly normal neurologically.  Skin:   Dry and intact without significant lesions or rashes. Psychiatric: Demonstrates good judgement and reason without abnormal affect or behaviors.  RELEVANT LABS AND IMAGING: CBC    Component Value Date/Time   WBC 7.6 01/22/2020 1444   WBC 6.7 03/09/2018 1230   RBC 4.07 01/22/2020 1444   RBC 4.14 03/09/2018 1230   HGB 12.0 01/22/2020 1444   HCT 35.1 01/22/2020 1444   PLT 500 (H) 01/22/2020 1444   MCV 86 01/22/2020 1444   MCH 29.5 01/22/2020 1444   MCH 30.8 03/09/2018 1230   MCHC 34.2 01/22/2020 1444   MCHC 33.9 03/09/2018 1230   RDW 14.3 01/22/2020 1444   LYMPHSABS 2.4 01/22/2020  1444   EOSABS 0.1 01/22/2020 1444   BASOSABS 0.0 01/22/2020 1444    CMP     Component Value Date/Time   NA 137 01/22/2020 1444   K 3.9 01/22/2020 1444   CL 104 01/22/2020 1444   CO2 21 01/22/2020 1444   GLUCOSE 87 01/22/2020 1444   BUN 10 01/22/2020 1444   CREATININE 0.74 01/22/2020 1444   CALCIUM 9.6 01/22/2020 1444   PROT 7.2 01/22/2020 1444   ALBUMIN 4.5 01/22/2020 1444   AST 18 01/22/2020 1444   ALT 29 01/22/2020 1444  ALKPHOS 91 01/22/2020 1444   BILITOT <0.2 01/22/2020 1444   GFRNONAA 98 01/22/2020 1444   GFRAA 113 01/22/2020 1444    Assessment: 1.  Screening for colorectal cancer: Patient is 21 and never had screening for colorectal cancer 2.  Rectal pain/anal fissure: Now healed after treatment with Hydrocortisone suppositories twice daily x1 week  Plan: 1.  Explained to the patient that the fissure was likely formed when she was constipated and straining/passing a hard stool.  Recommend she continue her MiraLAX daily from now on. 2.  It appears today on exam that the fissures are healed.  She does not need to use any further suppositories. 3.  Scheduled patient for screening colonoscopy in the [place] with Dr. Rush Landmark.  Did discuss risks, benefits, limitations and alternatives and the patient agrees to proceed.  Patient will be Covid tested 2 days prior to time of procedure. 4.  Patient to follow in clinic per recommendations from Dr. Rush Landmark after time of procedure.  Ellouise Newer, PA-C [facility] 03/22/2020, 9:14 AM  Cc: Horald Pollen, *

## 2020-03-23 NOTE — Progress Notes (Signed)
Attending Physician's Attestation   I have reviewed the chart.   I agree with the Advanced Practitioner's note, impression, and recommendations with any updates as below.  Happy to hear that prior fissures seem to have healed.  Agree with colon cancer screening.   Justice Britain, MD [facility] Advanced Endoscopy Office # [id number]

## 2020-03-26 ENCOUNTER — Ambulatory Visit (INDEPENDENT_AMBULATORY_CARE_PROVIDER_SITE_OTHER): Payer: Self-pay

## 2020-03-26 DIAGNOSIS — Z1159 Encounter for screening for other viral diseases: Secondary | ICD-10-CM

## 2020-03-28 ENCOUNTER — Other Ambulatory Visit: Payer: Self-pay

## 2020-03-28 ENCOUNTER — Ambulatory Visit (AMBULATORY_SURGERY_CENTER): Payer: Self-pay | Admitting: Gastroenterology

## 2020-03-28 ENCOUNTER — Encounter: Payer: Self-pay | Admitting: Gastroenterology

## 2020-03-28 VITALS — BP 130/76 | HR 75 | Temp 97.8°F | Resp 13 | Ht 59.0 in | Wt 166.0 lb

## 2020-03-28 DIAGNOSIS — Z1211 Encounter for screening for malignant neoplasm of colon: Secondary | ICD-10-CM

## 2020-03-28 DIAGNOSIS — K641 Second degree hemorrhoids: Secondary | ICD-10-CM

## 2020-03-28 DIAGNOSIS — K625 Hemorrhage of anus and rectum: Secondary | ICD-10-CM

## 2020-03-28 DIAGNOSIS — K6289 Other specified diseases of anus and rectum: Secondary | ICD-10-CM

## 2020-03-28 LAB — SARS CORONAVIRUS 2 (TAT 6-24 HRS): SARS Coronavirus 2: NEGATIVE

## 2020-03-28 MED ORDER — SODIUM CHLORIDE 0.9 % IV SOLN: 500.0000 mL | Freq: Once | INTRAVENOUS | Status: DC

## 2020-03-28 MED ORDER — HYDROCORTISONE ACETATE 25 MG RE SUPP: 25.0000 mg | Freq: Two times a day (BID) | RECTAL | 1 refills | Status: AC

## 2020-03-28 NOTE — Op Note (Signed)
[place] Patient Name: Brianna Vega Procedure Date: 03/28/2020 1:41 PM MRN: [id number] Endoscopist: Justice Britain , MD Age: 44 Referring MD:  Date of Birth: 08-14-1976 Gender: Female Account #: [id number] Procedure:                Colonoscopy Indications:              Screening for colorectal malignant neoplasm,                            Incidental - Rectal bleeding Medicines:                Monitored Anesthesia Care Procedure:                Pre-Anesthesia Assessment:                           - Prior to the procedure, a History and Physical                            was performed, and patient medications and                            allergies were reviewed. The patient's tolerance of                            previous anesthesia was also reviewed. The risks                            and benefits of the procedure and the sedation                            options and risks were discussed with the patient.                            All questions were answered, and informed consent                            was obtained. Prior Anticoagulants: The patient has                            taken no previous anticoagulant or antiplatelet                            agents. ASA Grade Assessment: I - A normal, healthy                            patient. After reviewing the risks and benefits,                            the patient was deemed in satisfactory condition to                            undergo the procedure.  After obtaining informed consent, the colonoscope                            was passed under direct vision. Throughout the                            procedure, the patient's blood pressure, pulse, and                            oxygen saturations were monitored continuously. The                            Colonoscope was introduced through the anus and                            advanced to the 5 cm into the ileum. The                      colonoscopy was performed without difficulty. The                            patient tolerated the procedure. The quality of the                            bowel preparation was good. The terminal ileum,                            ileocecal valve, appendiceal orifice, and rectum                            were photographed. Scope In: 1:51:12 PM Scope Out: 2:02:52 PM Scope Withdrawal Time: 0 hours 8 minutes 0 seconds  Total Procedure Duration: 0 hours 11 minutes 40 seconds  Findings:                 The digital rectal exam findings include                            hemorrhoids. Pertinent negatives include no                            palpable rectal lesions.                           The terminal ileum and ileocecal valve appeared                            normal.                           Normal mucosa was found in the entire colon.                           Non-bleeding non-thrombosed internal hemorrhoids  and anal papillae were found during retroflexion,                            during perianal exam and during digital exam. The                            hemorrhoids were Grade II (internal hemorrhoids                            that prolapse but reduce spontaneously). Complications:            No immediate complications. Estimated Blood Loss:     Estimated blood loss: none. Impression:               - Hemorrhoids found on digital rectal exam.                           - The examined portion of the ileum was normal.                           - Normal mucosa in the entire examined colon.                           - Non-bleeding non-thrombosed internal hemorrhoids                            and anal papillae noted. Recommendation:           - The patient will be observed post-procedure,                            until all discharge criteria are met.                           - Discharge patient to home.                           -  Patient has a contact number available for                            emergencies. The signs and symptoms of potential                            delayed complications were discussed with the                            patient. Return to normal activities tomorrow.                            Written discharge instructions were provided to the                            patient.                           - High fiber diet.                           -  Continue present medications.                           - Continue Miralax 1-2 times daily to help with                            passage of stool.                           - New prescription for Anusol to be sent and to be                            used on as needed basis for no more than 5-nights                            in a row.                           - Preparation H ointment can be used on the outside                            of anus and up to the first digit on an as needed                            basis.                           - Sitz bathes can be used nightly as needed.                           - Recommend a fiber supplement daily, consider                            Fibercon, 1 pill daily.                           - If recurrent issues with significant bleeding                            after having good bowel habits may consider                            hemorrhoidal banding.                           - The findings and recommendations were discussed                            with the patient. Justice Britain, MD 03/28/2020 2:12:41 PM

## 2020-03-28 NOTE — Progress Notes (Signed)
Pt's states no medical or surgical changes since previsit or office visit. 

## 2020-03-28 NOTE — Patient Instructions (Signed)
YOU HAD AN ENDOSCOPIC PROCEDURE TODAY AT THE [facility]:   Refer to the procedure report that was given to you for any specific questions about what was found during the examination.  If the procedure report does not answer your questions, please call your gastroenterologist to clarify.  If you requested that your care partner not be given the details of your procedure findings, then the procedure report has been included in a sealed envelope for you to review at your convenience later.  YOU SHOULD EXPECT: Some feelings of bloating in the abdomen. Passage of more gas than usual.  Walking can help get rid of the air that was put into your GI tract during the procedure and reduce the bloating. If you had a lower endoscopy (such as a colonoscopy or flexible sigmoidoscopy) you may notice spotting of blood in your stool or on the toilet paper. If you underwent a bowel prep for your procedure, you may not have a normal bowel movement for a few days.  Please Note:  You might notice some irritation and congestion in your nose or some drainage.  This is from the oxygen used during your procedure.  There is no need for concern and it should clear up in a day or so.  SYMPTOMS TO REPORT IMMEDIATELY:   Following lower endoscopy (colonoscopy or flexible sigmoidoscopy):  Excessive amounts of blood in the stool  Significant tenderness or worsening of abdominal pains  Swelling of the abdomen that is new, acute  Fever of 100F or higher  For urgent or emergent issues, a gastroenterologist can be reached at any hour by calling [phone]. Do not use MyChart messaging for urgent concerns.    DIET:  We do recommend a small meal at first, but then you may proceed to your regular diet.  Drink plenty of fluids but you should avoid alcoholic beverages for 24 hours.  ACTIVITY:  You should plan to take it easy for the rest of today and you should NOT DRIVE or use heavy machinery until tomorrow (because  of the sedation medicines used during the test).    FOLLOW UP: Our staff will call the number listed on your records 48-72 hours following your procedure to check on you and address any questions or concerns that you may have regarding the information given to you following your procedure. If we do not reach you, we will leave a message.  We will attempt to reach you two times.  During this call, we will ask if you have developed any symptoms of COVID 19. If you develop any symptoms (ie: fever, flu-like symptoms, shortness of breath, cough etc.) before then, please call [phone].  If you test positive for Covid 19 in the 2 weeks post procedure, please call and report this information to us.    If any biopsies were taken you will be contacted by phone or by letter within the next 1-3 weeks.  Please call us at [phone] if you have not heard about the biopsies in 3 weeks.    SIGNATURES/CONFIDENTIALITY: You and/or your care partner have signed paperwork which will be entered into your electronic medical record.  These signatures attest to the fact that that the information above on your After Visit Summary has been reviewed and is understood.  Full responsibility of the confidentiality of this discharge information lies with you and/or your care-partner. 

## 2020-03-28 NOTE — Progress Notes (Signed)
pt tolerated well. VSS. awake and to recovery. Report given to RN.  

## 2020-04-02 ENCOUNTER — Telehealth: Payer: Self-pay

## 2020-04-02 NOTE — Telephone Encounter (Signed)
No answer, left message to call if having any issues or concerns, B.Krzysztof Reichelt RN 

## 2020-04-02 NOTE — Telephone Encounter (Signed)
1st follow up call made.  NALM 

## 2023-01-04 ENCOUNTER — Ambulatory Visit
Admission: RE | Admit: 2023-01-04 | Discharge: 2023-01-04 | Disposition: A | Discharge status: Home / Self Care | Payer: Self-pay | Source: Ambulatory Visit | Attending: Emergency Medicine | Admitting: Emergency Medicine

## 2023-01-04 VITALS — BP 126/76 | HR 61 | Temp 98.1°F | Resp 18 | Wt 158.0 lb

## 2023-01-04 DIAGNOSIS — R051 Acute cough: Secondary | ICD-10-CM

## 2023-01-04 MED ORDER — ALBUTEROL SULFATE HFA 108 (90 BASE) MCG/ACT IN AERS: 1.0000 | INHALATION_SPRAY | Freq: Four times a day (QID) | RESPIRATORY_TRACT | 0 refills | Status: DC | PRN

## 2023-01-04 MED ORDER — PREDNISONE 10 MG (21) PO TBPK: ORAL_TABLET | Freq: Every day | ORAL | 0 refills | Status: DC

## 2023-07-08 ENCOUNTER — Ambulatory Visit
Admission: RE | Admit: 2023-07-08 | Discharge: 2023-07-08 | Disposition: A | Discharge status: Home / Self Care | Payer: Self-pay | Source: Ambulatory Visit | Attending: Family Medicine | Admitting: Family Medicine

## 2023-07-08 VITALS — BP 117/76 | HR 61 | Temp 98.0°F | Resp 18

## 2023-07-08 DIAGNOSIS — K6289 Other specified diseases of anus and rectum: Secondary | ICD-10-CM

## 2023-07-08 MED ORDER — CLOTRIMAZOLE-BETAMETHASONE 1-0.05 % EX CREA: TOPICAL_CREAM | Freq: Two times a day (BID) | CUTANEOUS | 0 refills | Status: AC

## 2023-07-08 MED ORDER — FLUCONAZOLE 150 MG PO TABS: 150.0000 mg | ORAL_TABLET | Freq: Once | ORAL | 0 refills | Status: AC

## 2024-01-15 ENCOUNTER — Ambulatory Visit
Admission: RE | Admit: 2024-01-15 | Discharge: 2024-01-15 | Disposition: A | Discharge status: Home / Self Care | Payer: Self-pay | Source: Ambulatory Visit | Attending: Family Medicine | Admitting: Family Medicine

## 2024-01-15 VITALS — BP 112/72 | HR 78 | Temp 98.3°F | Resp 16

## 2024-01-15 DIAGNOSIS — R202 Paresthesia of skin: Secondary | ICD-10-CM

## 2024-01-15 DIAGNOSIS — G514 Facial myokymia: Secondary | ICD-10-CM

## 2024-01-15 DIAGNOSIS — R200 Anesthesia of skin: Secondary | ICD-10-CM

## 2024-01-16 ENCOUNTER — Telehealth: Payer: Self-pay | Admitting: Urgent Care

## 2024-01-16 LAB — CBC
Hematocrit: 41.2 % (ref 34.0–46.6)
Hemoglobin: 13.8 g/dL (ref 11.1–15.9)
MCH: 31.2 pg (ref 26.6–33.0)
MCHC: 33.5 g/dL (ref 31.5–35.7)
MCV: 93 fL (ref 79–97)
Platelets: 366 x10E3/uL (ref 150–450)
RBC: 4.42 x10E6/uL (ref 3.77–5.28)
RDW: 13.1 % (ref 11.7–15.4)
WBC: 5.1 x10E3/uL (ref 3.4–10.8)

## 2024-01-16 LAB — COMPREHENSIVE METABOLIC PANEL WITH GFR
ALT: 28 IU/L (ref 0–32)
AST: 20 IU/L (ref 0–40)
Albumin: 4.2 g/dL (ref 3.9–4.9)
Alkaline Phosphatase: 91 IU/L (ref 44–121)
BUN/Creatinine Ratio: 19 (ref 9–23)
BUN: 12 mg/dL (ref 6–24)
Bilirubin Total: 0.2 mg/dL (ref 0.0–1.2)
CO2: 22 mmol/L (ref 20–29)
Calcium: 9.4 mg/dL (ref 8.7–10.2)
Chloride: 100 mmol/L (ref 96–106)
Creatinine, Ser: 0.63 mg/dL (ref 0.57–1.00)
Globulin, Total: 2.5 g/dL (ref 1.5–4.5)
Glucose: 113 mg/dL — ABNORMAL HIGH (ref 70–99)
Potassium: 4.5 mmol/L (ref 3.5–5.2)
Sodium: 136 mmol/L (ref 134–144)
Total Protein: 6.7 g/dL (ref 6.0–8.5)
eGFR: 109 mL/min/1.73 (ref >59)

## 2024-01-16 LAB — TSH: TSH: 2.13 u[IU]/mL (ref 0.450–4.500)

## 2024-01-16 MED ORDER — CYCLOBENZAPRINE HCL 5 MG PO TABS: 5.0000 mg | ORAL_TABLET | Freq: Every evening | ORAL | 0 refills | Status: DC | PRN

## 2024-09-14 ENCOUNTER — Ambulatory Visit
Admission: RE | Admit: 2024-09-14 | Discharge: 2024-09-14 | Disposition: A | Discharge status: Home / Self Care | Payer: Self-pay | Source: Ambulatory Visit | Attending: Family Medicine | Admitting: Family Medicine

## 2024-09-14 DIAGNOSIS — L03317 Cellulitis of buttock: Secondary | ICD-10-CM

## 2024-09-14 DIAGNOSIS — L0231 Cutaneous abscess of buttock: Secondary | ICD-10-CM

## 2024-09-14 MED ORDER — IBUPROFEN 800 MG PO TABS: 800.0000 mg | ORAL_TABLET | Freq: Three times a day (TID) | ORAL | 0 refills | Status: AC

## 2024-09-14 MED ORDER — DOXYCYCLINE HYCLATE 100 MG PO CAPS: 100.0000 mg | ORAL_CAPSULE | Freq: Two times a day (BID) | ORAL | 0 refills | Status: DC

## 2024-09-15 ENCOUNTER — Encounter: Payer: Self-pay | Admitting: Gastroenterology

## 2024-11-01 ENCOUNTER — Ambulatory Visit
Admission: RE | Admit: 2024-11-01 | Discharge: 2024-11-01 | Disposition: A | Discharge status: Home / Self Care | Payer: Self-pay | Source: Ambulatory Visit | Attending: Family Medicine | Admitting: Family Medicine

## 2024-11-01 VITALS — BP 114/79 | HR 87 | Temp 98.2°F | Resp 18

## 2024-11-01 DIAGNOSIS — L02212 Cutaneous abscess of back [any part, except buttock]: Secondary | ICD-10-CM

## 2024-11-01 MED ORDER — DOXYCYCLINE HYCLATE 100 MG PO CAPS: 100.0000 mg | ORAL_CAPSULE | Freq: Two times a day (BID) | ORAL | 0 refills | Status: AC

## 2024-11-01 NOTE — Discharge Instructions (Addendum)
 Start doxycycline twice daily for 10 days.  Warm compresses to the area often will encourage it to continue to drain.  Please follow-up with your PCP if symptoms do not improve.  Please go to the ER if you develop any worsening symptoms.  I hope you feel better soon!

## 2024-11-01 NOTE — ED Triage Notes (Signed)
 Pt present with c/o an abscess on the rt side upper back x almost two weeks. States the abscess has grown and worsened in pain. Pt reports it is starting to drain.

## 2024-11-01 NOTE — ED Provider Notes (Signed)
 [facility]    CSN: [id number] Arrival date & time: 11/01/24  1015      History   Chief Complaint Chief Complaint  Patient presents with   Abscess    I have a huge abcess on my back right side, I need strong antibiotic please, in huge pain and discomfort - Entered by patient    HPI Brianna Vega is a 48 y.o. female presents for abscess.  Patient is accompanied by family who translates that she speaks Spanish and they declined translation line.  Patient reports 1-1/2 weeks of a abscess on her right upper back that is worsening.  States she states she had fevers the first 3 days but no fever since then.  States it began to drain a purulent discharge.  Has had a abscess in the past as well.  No history of MRSA.  She has not used any OTC treatments for symptoms since onset.  No other concerns at this time.   Abscess   Past Medical History:  Diagnosis Date   Anal fissure    Anxiety    Hypertension    Pre-diabetes     Patient Active Problem List   Diagnosis Date Noted   History of ovarian cyst 03/09/2018    Past Surgical History:  Procedure Laterality Date   CESAREAN SECTION     x2;; 11 and 22 years ago    OB History   No obstetric history on file.      Home Medications    Prior to Admission medications   Medication Sig Start Date End Date Taking? Authorizing Provider  doxycycline (VIBRAMYCIN) 100 MG capsule Take 1 capsule (100 mg total) by mouth 2 (two) times daily. 11/01/24  Yes Tineshia Becraft, Jodi R, NP  ibuprofen (ADVIL) 800 MG tablet Take 1 tablet (800 mg total) by mouth 3 (three) times daily. 09/14/24  Yes Christopher Savannah, PA-C  Multiple Vitamin (MULTIVITAMIN) capsule Take 1 capsule by mouth daily.   Yes [provider]  rosuvastatin  (CRESTOR ) 10 MG tablet Take 1 tablet (10 mg total) by mouth daily. 01/23/20  Yes Sagardia, Emil Schanz, MD  hydrocortisone  (ANUSOL -HC) 2.5 % rectal cream Place 1 application rectally 2 (two) times daily. 01/11/20   Sagardia,  Miguel Jose, MD  hydrocortisone  (ANUSOL -HC) 25 MG suppository Place 1 suppository (25 mg total) rectally 2 (two) times daily. 03/28/20   Mansouraty, Aloha Raddle., MD  metFORMIN  (GLUCOPHAGE ) 500 MG tablet Take 1 tablet (500 mg total) by mouth 2 (two) times daily with a meal. 01/23/20   Sagardia, Emil Schanz, MD  OVER THE COUNTER MEDICATION     [provider]    Family History Family History  Problem Relation Age of Onset   Hypertension Father    Diabetes Paternal Aunt    Colon cancer Neg Hx    Esophageal cancer Neg Hx    Pancreatic cancer Neg Hx    Stomach cancer Neg Hx    Liver disease Neg Hx     Social History Social History   Tobacco Use   Smoking status: Never   Smokeless tobacco: Never  Vaping Use   Vaping status: Never Used  Substance Use Topics   Alcohol use: Never   Drug use: Never     Allergies   Patient has no known allergies.   Review of Systems Review of Systems  Skin:        Abscess right upper back     Physical Exam Triage Vital Signs ED Triage Vitals  Encounter Vitals Group     BP 11/01/24 1026 114/79     Girls Systolic BP Percentile --      Girls Diastolic BP Percentile --      Boys Systolic BP Percentile --      Boys Diastolic BP Percentile --      Pulse Rate 11/01/24 1026 87     Resp 11/01/24 1026 18     Temp 11/01/24 1026 98.2 F (36.8 C)     Temp Source 11/01/24 1026 Oral     SpO2 11/01/24 1026 97 %     Weight --      Height --      Head Circumference --      Peak Flow --      Pain Score 11/01/24 1024 7     Pain Loc --      Pain Education --      Exclude from Growth Chart --    No data found.  Updated Vital Signs BP 114/79 (BP Location: Right Arm)   Pulse 87   Temp 98.2 F (36.8 C) (Oral)   Resp 18   SpO2 97%   Visual Acuity Right Eye Distance:   Left Eye Distance:   Bilateral Distance:    Right Eye Near:   Left Eye Near:    Bilateral Near:     Physical Exam Vitals and nursing note reviewed.   Constitutional:      General: She is not in acute distress.    Appearance: Normal appearance. She is not ill-appearing.  HENT:     Head: Normocephalic and atraumatic.  Eyes:     Pupils: Pupils are equal, round, and reactive to light.  Cardiovascular:     Rate and Rhythm: Normal rate.  Pulmonary:     Effort: Pulmonary effort is normal.  Skin:    General: Skin is warm and dry.         Comments: 5 x 3 cm erythematous, warm, indurated with minimal fluctuance to the right upper back.  Neurological:     General: No focal deficit present.     Mental Status: She is alert and oriented to person, place, and time.  Psychiatric:        Mood and Affect: Mood normal.        Behavior: Behavior normal.      UC Treatments / Results  Labs (all labs ordered are listed, but only abnormal results are displayed) Labs Reviewed - No data to display  EKG   Radiology No results found.  Procedures Procedures (including critical care time)  Medications Ordered in UC Medications - No data to display  Initial Impression / Assessment and Plan / UC Course  I have reviewed the triage vital signs and the nursing notes.  Pertinent labs & imaging results that were available during my care of the patient were reviewed by me and considered in my medical decision making (see chart for details).     Discussed I&D but as it is already draining patient declined.  Will do doxycycline and encouraged warm compresses.  Nursing staff was able establish her with a PCP for follow-up and overall health maintenance.  ER precautions reviewed and patient verbalized understanding Final Clinical Impressions(s) / UC Diagnoses   Final diagnoses:  Abscess of back     Discharge Instructions      Start doxycycline twice daily for 10 days.  Warm compresses to the area often will encourage it to continue to drain.  Please follow-up  with your PCP if symptoms do not improve.  Please go to the ER if you develop any  worsening symptoms.  I hope you feel better soon!    ED Prescriptions     Medication Sig Dispense Auth. Provider   doxycycline (VIBRAMYCIN) 100 MG capsule Take 1 capsule (100 mg total) by mouth 2 (two) times daily. 20 capsule Javontay Vandam, Jodi R, NP      PDMP not reviewed this encounter.   Loreda Myla SAUNDERS, NP 11/01/24 1040

## 2024-11-10 ENCOUNTER — Ambulatory Visit (INDEPENDENT_AMBULATORY_CARE_PROVIDER_SITE_OTHER): Payer: Self-pay | Admitting: Sports Medicine

## 2024-11-10 ENCOUNTER — Encounter: Payer: Self-pay | Admitting: Sports Medicine

## 2024-11-10 VITALS — BP 115/73 | HR 74 | Temp 98.5°F | Ht 60.0 in | Wt 172.4 lb

## 2024-11-10 DIAGNOSIS — Z6833 Body mass index (BMI) 33.0-33.9, adult: Secondary | ICD-10-CM

## 2024-11-10 DIAGNOSIS — Z113 Encounter for screening for infections with a predominantly sexual mode of transmission: Secondary | ICD-10-CM

## 2024-11-10 DIAGNOSIS — Z1329 Encounter for screening for other suspected endocrine disorder: Secondary | ICD-10-CM

## 2024-11-10 DIAGNOSIS — Z124 Encounter for screening for malignant neoplasm of cervix: Secondary | ICD-10-CM

## 2024-11-10 DIAGNOSIS — Z23 Encounter for immunization: Secondary | ICD-10-CM

## 2024-11-10 DIAGNOSIS — E785 Hyperlipidemia, unspecified: Secondary | ICD-10-CM

## 2024-11-10 DIAGNOSIS — Z131 Encounter for screening for diabetes mellitus: Secondary | ICD-10-CM

## 2024-11-10 DIAGNOSIS — L03319 Cellulitis of trunk, unspecified: Secondary | ICD-10-CM

## 2024-11-10 DIAGNOSIS — L02219 Cutaneous abscess of trunk, unspecified: Secondary | ICD-10-CM

## 2024-11-10 NOTE — Progress Notes (Signed)
 Careteam: Patient Care Team: Sherlynn Madden, MD as PCP - General (Internal Medicine)  No Known Allergies  Chief Complaint  Patient presents with   Establish Care    New pt est care. Pt has bad 2 abscess on her back in the past. She would also like a flu shot today.    Discussed the use of AI scribe software for clinical note transcription with the patient, who gave verbal consent to proceed.  History of Present Illness  Brianna Vega is a 48 year old female who presents to establish care and  follow-up after treatment for a skin abscess.  The skin abscess was draining for about five days but has since stopped. No fever or chills are present. She was prescribed antibiotics, which she has been taking twice a day, with one more day remaining on the course.  She has a history of hypertension, prediabetes, anxiety, and an anal fissure. A colonoscopy in 2020 revealed hemorrhoids and an anal fissure, but no polyps or other abnormalities. No current abdominal pain, nausea, or vomiting. She experiences regular bowel movements with occasional constipation and avoids spicy foods to prevent hemorrhoid flare-ups.  No urinary problems, such as burning or infections. No history of smoking or respiratory issues. She denies any heart problems, murmurs, or stomach ulcers. She has not had any surgeries other than a C-section.  Her family history includes her father, who passed away from a heart attack and was a heavy alcoholic. No other significant family history of diabetes, hypertension, or cancer.    Review of Systems:  Review of Systems  Constitutional:  Negative for chills and fever.  HENT:  Negative for congestion and sore throat.   Eyes:  Negative for double vision.  Respiratory:  Negative for cough, sputum production and shortness of breath.   Cardiovascular:  Negative for chest pain, palpitations and leg swelling.  Gastrointestinal:  Negative for abdominal pain, heartburn and  nausea.  Genitourinary:  Negative for dysuria, frequency and hematuria.  Musculoskeletal:  Negative for falls and myalgias.  Neurological:  Negative for dizziness, sensory change and focal weakness.   Negative unless indicated in HPI.   Past Medical History:  Diagnosis Date   Anal fissure    Anxiety    Hypertension    Pre-diabetes    Past Surgical History:  Procedure Laterality Date   CESAREAN SECTION     x2;; 11 and 22 years ago   Social History:   reports that she has never smoked. She has never used smokeless tobacco. She reports that she does not drink alcohol and does not use drugs.  Family History  Problem Relation Age of Onset   Hypertension Father    Diabetes Paternal Aunt    Colon cancer Neg Hx    Esophageal cancer Neg Hx    Pancreatic cancer Neg Hx    Stomach cancer Neg Hx    Liver disease Neg Hx     Medications: Patient's Medications  New Prescriptions   No medications on file  Previous Medications   DOXYCYCLINE (VIBRAMYCIN) 100 MG CAPSULE    Take 1 capsule (100 mg total) by mouth 2 (two) times daily.   HYDROCORTISONE  (ANUSOL -HC) 2.5 % RECTAL CREAM    Place 1 application rectally 2 (two) times daily.   HYDROCORTISONE  (ANUSOL -HC) 25 MG SUPPOSITORY    Place 1 suppository (25 mg total) rectally 2 (two) times daily.   IBUPROFEN (ADVIL) 800 MG TABLET    Take 1 tablet (800 mg total) by mouth  3 (three) times daily.   METFORMIN  (GLUCOPHAGE ) 500 MG TABLET    Take 1 tablet (500 mg total) by mouth 2 (two) times daily with a meal.   MULTIPLE VITAMIN (MULTIVITAMIN) CAPSULE    Take 1 capsule by mouth daily.   OVER THE COUNTER MEDICATION       ROSUVASTATIN  (CRESTOR ) 10 MG TABLET    Take 1 tablet (10 mg total) by mouth daily.  Modified Medications   No medications on file  Discontinued Medications   No medications on file    Physical Exam: There were no vitals filed for this visit. There is no height or weight on file to calculate BMI. BP Readings from Last 3  Encounters:  11/01/24 114/79  09/14/24 137/81  01/15/24 112/72   Wt Readings from Last 3 Encounters:  01/04/23 158 lb (71.7 kg)  03/28/20 166 lb (75.3 kg)  03/22/20 166 lb 3.2 oz (75.4 kg)    Physical Exam Constitutional:      Appearance: Normal appearance.  HENT:     Head: Normocephalic and atraumatic.  Cardiovascular:     Rate and Rhythm: Normal rate and regular rhythm.  Pulmonary:     Effort: Pulmonary effort is normal. No respiratory distress.     Breath sounds: Normal breath sounds. No wheezing.  Abdominal:     General: Bowel sounds are normal. There is no distension.     Tenderness: There is no abdominal tenderness. There is no guarding or rebound.     Comments:    Musculoskeletal:        General: No swelling or tenderness.     Comments: Abscess on left upper back  Soft, point tenderness, erythema,  Indurated area  about 3x4 cm   Neurological:     Mental Status: She is alert. Mental status is at baseline.     Sensory: No sensory deficit.     Motor: No weakness.     Labs reviewed: Basic Metabolic Panel: Recent Labs    01/15/24 0953  NA 136  K 4.5  CL 100  CO2 22  GLUCOSE 113*  BUN 12  CREATININE 0.63  CALCIUM  9.4  TSH 2.130   Liver Function Tests: Recent Labs    01/15/24 0953  AST 20  ALT 28  ALKPHOS 91  BILITOT 0.2  PROT 6.7  ALBUMIN 4.2   No results for input(s): LIPASE, AMYLASE in the last 8760 hours. No results for input(s): AMMONIA in the last 8760 hours. CBC: Recent Labs    01/15/24 0952  WBC 5.1  HGB 13.8  HCT 41.2  MCV 93  PLT 366   Lipid Panel: No results for input(s): CHOL, HDL, LDLCALC, TRIG, CHOLHDL, LDLDIRECT in the last 8760 hours. TSH: Recent Labs    01/15/24 0953  TSH 2.130   A1C: Lab Results  Component Value Date   HGBA1C 6.4 (H) 01/22/2020    Assessment & Plan Cellulitis and abscess of trunk Improving Cont with abx Instructed patient to do warm compressors  Orders:   Basic metabolic  panel with GFR; Future   CBC with Differential/Platelet; Future  Screening for STD (sexually transmitted disease)  Orders:   HIV antibody (with reflex); Future   Hepatitis C Antibody; Future  Screening for diabetes mellitus  Orders:   HgB A1c; Future  BMI 33.0-33.9,adult Instructed patient to exercise regularly       Hyperlipidemia, unspecified hyperlipidemia type  Orders:   Lipid Profile; Future  Screening for thyroid disorder  Orders:   TSH  Screening for cervical cancer  Orders:   Ambulatory referral to Obstetrics / Gynecology  Immunization due  Orders:   Flu vaccine trivalent PF, 6mos and older(Flulaval,Afluria,Fluarix,Fluzone)

## 2024-11-17 ENCOUNTER — Other Ambulatory Visit: Payer: Self-pay

## 2024-11-17 DIAGNOSIS — E785 Hyperlipidemia, unspecified: Secondary | ICD-10-CM

## 2024-11-17 DIAGNOSIS — Z131 Encounter for screening for diabetes mellitus: Secondary | ICD-10-CM

## 2024-11-17 DIAGNOSIS — L03319 Cellulitis of trunk, unspecified: Secondary | ICD-10-CM

## 2024-11-17 DIAGNOSIS — L02219 Cutaneous abscess of trunk, unspecified: Secondary | ICD-10-CM

## 2024-11-17 DIAGNOSIS — Z1329 Encounter for screening for other suspected endocrine disorder: Secondary | ICD-10-CM

## 2024-11-17 DIAGNOSIS — Z113 Encounter for screening for infections with a predominantly sexual mode of transmission: Secondary | ICD-10-CM

## 2024-11-17 LAB — BASIC METABOLIC PANEL WITH GFR
BUN: 16 mg/dL (ref 6–23)
CO2: 32 meq/L (ref 19–32)
Calcium: 9.8 mg/dL (ref 8.4–10.5)
Chloride: 102 meq/L (ref 96–112)
Creatinine, Ser: 0.77 mg/dL (ref 0.40–1.20)
GFR: 89.83 mL/min (ref >60.00)
Glucose, Bld: 120 mg/dL — ABNORMAL HIGH (ref 70–99)
Potassium: 4.7 meq/L (ref 3.5–5.1)
Sodium: 139 meq/L (ref 135–145)

## 2024-11-17 LAB — CBC WITH DIFFERENTIAL/PLATELET
Basophils Absolute: 0 K/uL (ref 0.0–0.1)
Basophils Relative: 0.9 % (ref 0.0–3.0)
Eosinophils Absolute: 0.2 K/uL (ref 0.0–0.7)
Eosinophils Relative: 5 % (ref 0.0–5.0)
HCT: 40.1 % (ref 36.0–46.0)
Hemoglobin: 13.5 g/dL (ref 12.0–15.0)
Lymphocytes Relative: 45.9 % (ref 12.0–46.0)
Lymphs Abs: 2 K/uL (ref 0.7–4.0)
MCHC: 33.7 g/dL (ref 30.0–36.0)
MCV: 93.5 fl (ref 78.0–100.0)
Monocytes Absolute: 0.3 K/uL (ref 0.1–1.0)
Monocytes Relative: 6.8 % (ref 3.0–12.0)
Neutro Abs: 1.8 K/uL (ref 1.4–7.7)
Neutrophils Relative %: 41.4 % — ABNORMAL LOW (ref 43.0–77.0)
Platelets: 437 K/uL — ABNORMAL HIGH (ref 150.0–400.0)
RBC: 4.29 Mil/uL (ref 3.87–5.11)
RDW: 14 % (ref 11.5–15.5)
WBC: 4.3 K/uL (ref 4.0–10.5)

## 2024-11-17 LAB — LIPID PANEL
Cholesterol: 326 mg/dL — ABNORMAL HIGH (ref 0–200)
HDL: 50.3 mg/dL (ref >39.00)
LDL Cholesterol: 218 mg/dL — ABNORMAL HIGH (ref 0–99)
NonHDL: 276.03
Total CHOL/HDL Ratio: 6
Triglycerides: 289 mg/dL — ABNORMAL HIGH (ref 0.0–149.0)
VLDL: 57.8 mg/dL — ABNORMAL HIGH (ref 0.0–40.0)

## 2024-11-17 LAB — TSH: TSH: 2.97 u[IU]/mL (ref 0.35–5.50)

## 2024-11-17 LAB — HEMOGLOBIN A1C: Hgb A1c MFr Bld: 6.6 % — ABNORMAL HIGH (ref 4.6–6.5)

## 2024-11-18 LAB — HEPATITIS C ANTIBODY: Hepatitis C Ab: NONREACTIVE

## 2024-11-18 LAB — HIV ANTIBODY (ROUTINE TESTING W REFLEX)
HIV 1&2 Ab, 4th Generation: NONREACTIVE
HIV FINAL INTERPRETATION: NEGATIVE

## 2024-11-26 ENCOUNTER — Ambulatory Visit: Payer: Self-pay | Admitting: Sports Medicine

## 2024-11-26 DIAGNOSIS — R7303 Prediabetes: Secondary | ICD-10-CM

## 2024-11-26 DIAGNOSIS — E785 Hyperlipidemia, unspecified: Secondary | ICD-10-CM

## 2024-11-26 MED ORDER — METFORMIN HCL 500 MG PO TABS: 500.0000 mg | ORAL_TABLET | Freq: Every day | ORAL | 3 refills | Status: AC

## 2024-11-26 MED ORDER — ROSUVASTATIN CALCIUM 10 MG PO TABS: 10.0000 mg | ORAL_TABLET | Freq: Every day | ORAL | 3 refills | Status: AC
